# Patient Record
Sex: Female | Born: 1945 | Race: White | Hispanic: No | Smoking: Never smoker
Health system: Southern US, Community
[De-identification: ages and names within clinical notes are randomized; demographics above are authoritative.]

## PROBLEM LIST (undated history)

## (undated) DIAGNOSIS — E079 Disorder of thyroid, unspecified: Secondary | ICD-10-CM

## (undated) DIAGNOSIS — J449 Chronic obstructive pulmonary disease, unspecified: Secondary | ICD-10-CM

---

## 2004-03-02 ENCOUNTER — Emergency Department: Payer: Self-pay | Admitting: Emergency Medicine

## 2006-08-20 ENCOUNTER — Ambulatory Visit: Payer: Self-pay | Admitting: Specialist

## 2013-06-13 ENCOUNTER — Inpatient Hospital Stay: Payer: Self-pay | Admitting: Internal Medicine

## 2013-06-13 LAB — URINALYSIS, COMPLETE
Bilirubin,UR: NEGATIVE
GLUCOSE, UR: NEGATIVE mg/dL (ref 0–75)
Hyaline Cast: 6
Leukocyte Esterase: NEGATIVE
NITRITE: NEGATIVE
Ph: 6 (ref 4.5–8.0)
RBC,UR: 9 /HPF (ref 0–5)
Specific Gravity: 1.02 (ref 1.003–1.030)
Squamous Epithelial: 1

## 2013-06-13 LAB — CBC WITH DIFFERENTIAL/PLATELET
Basophil #: 0 10*3/uL (ref 0.0–0.1)
Basophil %: 0.1 %
EOS ABS: 0 10*3/uL (ref 0.0–0.7)
EOS PCT: 0 %
HCT: 37.8 % (ref 35.0–47.0)
HGB: 12.6 g/dL (ref 12.0–16.0)
Lymphocyte #: 0.7 10*3/uL — ABNORMAL LOW (ref 1.0–3.6)
Lymphocyte %: 4.1 %
MCH: 30.9 pg (ref 26.0–34.0)
MCHC: 33.2 g/dL (ref 32.0–36.0)
MCV: 93 fL (ref 80–100)
Monocyte #: 1.2 x10 3/mm — ABNORMAL HIGH (ref 0.2–0.9)
Monocyte %: 7 %
NEUTROS ABS: 15.4 10*3/uL — AB (ref 1.4–6.5)
Neutrophil %: 88.8 %
PLATELETS: 278 10*3/uL (ref 150–440)
RBC: 4.06 10*6/uL (ref 3.80–5.20)
RDW: 13.7 % (ref 11.5–14.5)
WBC: 17.3 10*3/uL — ABNORMAL HIGH (ref 3.6–11.0)

## 2013-06-13 LAB — PRO B NATRIURETIC PEPTIDE: B-Type Natriuretic Peptide: 2273 pg/mL — ABNORMAL HIGH (ref 0–125)

## 2013-06-13 LAB — BASIC METABOLIC PANEL
Anion Gap: 7 (ref 7–16)
BUN: 34 mg/dL — ABNORMAL HIGH (ref 7–18)
CO2: 29 mmol/L (ref 21–32)
Calcium, Total: 8.6 mg/dL (ref 8.5–10.1)
Chloride: 101 mmol/L (ref 98–107)
Creatinine: 0.99 mg/dL (ref 0.60–1.30)
EGFR (Non-African Amer.): 58 — ABNORMAL LOW
Glucose: 121 mg/dL — ABNORMAL HIGH (ref 65–99)
OSMOLALITY: 283 (ref 275–301)
Potassium: 4 mmol/L (ref 3.5–5.1)
SODIUM: 137 mmol/L (ref 136–145)

## 2013-06-13 LAB — CK TOTAL AND CKMB (NOT AT ARMC): CK, Total: 28 U/L (ref 21–215)

## 2013-06-13 LAB — APTT: ACTIVATED PTT: 29 s (ref 23.6–35.9)

## 2013-06-13 LAB — TROPONIN I: TROPONIN-I: 0.08 ng/mL — AB

## 2013-06-13 LAB — PROTIME-INR
INR: 1
PROTHROMBIN TIME: 13.5 s (ref 11.5–14.7)

## 2013-06-14 LAB — BASIC METABOLIC PANEL
Anion Gap: 6 — ABNORMAL LOW (ref 7–16)
BUN: 26 mg/dL — ABNORMAL HIGH (ref 7–18)
CALCIUM: 8.8 mg/dL (ref 8.5–10.1)
CO2: 30 mmol/L (ref 21–32)
Chloride: 100 mmol/L (ref 98–107)
Creatinine: 0.64 mg/dL (ref 0.60–1.30)
EGFR (Non-African Amer.): >60
GLUCOSE: 138 mg/dL — AB (ref 65–99)
Osmolality: 279 (ref 275–301)
Potassium: 4.4 mmol/L (ref 3.5–5.1)
Sodium: 136 mmol/L (ref 136–145)

## 2013-06-14 LAB — CBC WITH DIFFERENTIAL/PLATELET
BASOS ABS: 0 10*3/uL (ref 0.0–0.1)
Basophil %: 0 %
EOS ABS: 0 10*3/uL (ref 0.0–0.7)
Eosinophil %: 0 %
HCT: 36 % (ref 35.0–47.0)
HGB: 12.1 g/dL (ref 12.0–16.0)
LYMPHS ABS: 0.4 10*3/uL — AB (ref 1.0–3.6)
Lymphocyte %: 2.7 %
MCH: 31.1 pg (ref 26.0–34.0)
MCHC: 33.5 g/dL (ref 32.0–36.0)
MCV: 93 fL (ref 80–100)
Monocyte #: 0.2 x10 3/mm (ref 0.2–0.9)
Monocyte %: 1.7 %
Neutrophil #: 13.7 10*3/uL — ABNORMAL HIGH (ref 1.4–6.5)
Neutrophil %: 95.6 %
Platelet: 246 10*3/uL (ref 150–440)
RBC: 3.87 10*6/uL (ref 3.80–5.20)
RDW: 13.4 % (ref 11.5–14.5)
WBC: 14.3 10*3/uL — AB (ref 3.6–11.0)

## 2013-06-15 LAB — URINE CULTURE

## 2013-06-16 LAB — CBC WITH DIFFERENTIAL/PLATELET
BASOS ABS: 0 10*3/uL (ref 0.0–0.1)
BASOS PCT: 0.1 %
EOS ABS: 0 10*3/uL (ref 0.0–0.7)
Eosinophil %: 0 %
HCT: 36.9 % (ref 35.0–47.0)
HGB: 12.3 g/dL (ref 12.0–16.0)
LYMPHS ABS: 0.4 10*3/uL — AB (ref 1.0–3.6)
Lymphocyte %: 3.5 %
MCH: 31.1 pg (ref 26.0–34.0)
MCHC: 33.4 g/dL (ref 32.0–36.0)
MCV: 93 fL (ref 80–100)
MONO ABS: 0.4 x10 3/mm (ref 0.2–0.9)
Monocyte %: 3.3 %
Neutrophil #: 11.8 10*3/uL — ABNORMAL HIGH (ref 1.4–6.5)
Neutrophil %: 93.1 %
PLATELETS: 317 10*3/uL (ref 150–440)
RBC: 3.96 10*6/uL (ref 3.80–5.20)
RDW: 13.6 % (ref 11.5–14.5)
WBC: 12.7 10*3/uL — ABNORMAL HIGH (ref 3.6–11.0)

## 2013-06-17 LAB — EXPECTORATED SPUTUM ASSESSMENT W REFEX TO RESP CULTURE

## 2013-06-18 LAB — CULTURE, BLOOD (SINGLE)

## 2014-09-02 NOTE — H&P (Signed)
PATIENT NAME:  Teresa Hartman, [name] MR#:  [id number] DATE OF BIRTH:  08-11-1945  DATE OF ADMISSION:  06/13/2013  PRIMARY CARE PROVIDER: Alan MulderShamil J. Morayati, MD  EMERGENCY DEPARTMENT REFERRING PHYSICIAN: Rockne MenghiniAnne-Caroline Norman, MD  CHIEF COMPLAINT: Decrease in responsiveness, shortness of breath.   HISTORY OF PRESENT ILLNESS: The patient is a 69 year old white female with history of asthma according to her as well as hypothyroidism and borderline diabetes, who has been having shortness of breath for about 1 week. Was seen at a walk-in clinic on Saturday and was discharged on some cough syrup with hydrocodone as well as Mucinex and some antibiotics  with Omnicef. The patient's symptoms continued to not improve. She has not been eating or drinking much and today, when her son went to check on her, she was slumped over and very hard to arouse. She was brought to the ED and in the ER received Narcan with no significant improvement in her symptoms. She did have an ABG which showed that she had hypercarbia with a pCO2 of 64. She was placed on BiPAP. Since then, her mental status is a little improved. She has not had any chest pains or any palpitations. Denies any abdominal pain, nausea, vomiting or diarrhea. Has not had any fevers.   PAST MEDICAL HISTORY: Significant for asthma, hypothyroidism, diabetes type 2.   ALLERGIES: PENICILLIN.   CURRENT MEDICATIONS: At home Advair 1 puff b.i.d., she states that she uses that as needed. Cefdinir 300 mg 1 tab p.o. b.i.d., Lasix 40 daily, homatropine/ hydrocodone 1.5 mg per 5 mg per 5 mL daily as needed for cough, Invokana 300 mg daily, Synthroid 75 mcg daily, vitamin D2 50,000 international units 1 tablet b.i.d.   SOCIAL HISTORY: Smokes a few cigarettes a day. Has been a lifelong smoker, drinks socially. No drug use. Lives alone.   FAMILY HISTORY: Positive for hypertension.   REVIEW OF SYSTEMS: CONSTITUTIONAL: Denies any fevers. Complains of fatigue, weakness. No pain.  No weight loss. No weight gain.  EYES: No blurred or double vision. No glaucoma or cataracts.  ENT: No tinnitus. No ear pain. No hearing loss. No seasonal or year-round allergies. No difficulty swallowing.  RESPIRATORY: Complains of cough, wheezing. No hemoptysis. Has dyspnea. Reports asthma.  CARDIOVASCULAR: Denies any chest pain, orthopnea, edema, arrhythmia or palpitations.  GASTROINTESTINAL: No nausea, vomiting, diarrhea. No abdominal pain. No hematemesis. No melena.  GENITOURINARY: Denies any dysuria, hematuria, renal calculus or frequency.  ENDOCRINE: Denies any polyuria, nocturia. Thyroid problems.  HEMATOLOGIC AND LYMPHATIC: Denies anemia, easy bruisability or bleeding.  SKIN: No acne. No rash. No changes in mole, hair or skin.  MUSCULOSKELETAL: Denies any pain in the neck, back or shoulder.  NEUROLOGIC: No numbness. No CVA. No TIA. No seizures.  PSYCHIATRIC: No anxiety. No insomnia. No ADD.   PHYSICAL EXAMINATION: VITAL SIGNS: Temperature 102.1, pulse 118, respirations 26, blood pressure 140/66, O2 was 54% on room air, currently on BiPAP.  Appears critically ill.  HEENT: Head atraumatic, normocephalic. Pupils equally round, reactive to light and accommodation. There is no conjunctival pallor. No scleral icterus. Nasal exam shows no drainage or ulceration. Oropharynx is clear without any exudate.  NECK: Supple without any JVD.  CARDIOVASCULAR: Regular rate and rhythm. No murmurs, rubs, clicks, gallops,  LUNGS: She has got some accessory muscle usage, currently on BiPAP. Has bilateral wheezing. There are no rhonchi or crackles.  ABDOMEN: Soft, nontender, nondistended. Positive bowel sounds x 4.  EXTREMITIES: No clubbing, cyanosis or edema.  SKIN: No rash.  LYMPHATICS:  No lymph nodes palpable.  VASCULAR: Good DP, PT pulses.  PSYCHIATRIC: Not anxious or depressed.  NEUROLOGIC: The patient is intermittently drowsy but is able to answer questions. Cranial nerves II through XII grossly  intact. No focal deficits.  PSYCHIATRIC: Not anxious or depressed.   LABORATORY, DIAGNOSTIC AND RADIOLOGICAL DATA:  So far, ABG with pH 7.25, pCO2 of 64, pO2 of 53. WBC count 17.3, hemoglobin 12.6, platelet count 278. INR 1.0. Urinalysis is negative. Chest x-ray shows questionable right hilar adenopathy, contrast-enhanced CT to further evaluate interstitial infiltrate in the right middle and lower lobes.   ASSESSMENT AND PLAN: The patient is a 69 year old white female with a history of asthma, diabetes, hypothyroidism, presents with decrease in responsiveness, acute respiratory failure.  1.  Acute respiratory failure, hypercarbic in nature due to likely a combination of asthma flare, asthma/chronic obstructive pulmonary disease exacerbation as well as pneumonia. At this time, we will treat her with IV Solu-Medrol q.6 hours. Nebulizers around the clock. Will place her on IV Levaquin. Continue BiPAP. Will wean as tolerated. Pulmonary consult.  2.  Pneumonia.  We will treat her for community-acquired pneumonia with IV Levaquin.  3.  Hypothyroidism. Continue Synthroid.  4.  Diabetes type 2. We will place her on sliding scale. She is on Invokana and she reports that she is on metformin; however, that is not one of her medications. I will just keep her on sliding scale insulin for now.  5.  Miscellaneous. We will do heparin for deep vein thrombosis prophylaxis.   NOTE: 50 minutes of critical care time spent on this patient.     ____________________________ Lacie Scotts. Allena Katz, MD shp:cs D: 06/13/2013 15:34:49 ET T: 06/13/2013 15:56:33 ET JOB#: [id number]  cc: Shamyia Grandpre H. Allena Katz, MD, <Dictator> Charise Carwin MD ELECTRONICALLY SIGNED 06/26/2013 13:10

## 2014-09-02 NOTE — Discharge Summary (Signed)
PATIENT NAME:  Teresa Hartman, Teresa Hartman MR#:  [id number] DATE OF BIRTH:  1945/10/16  DATE OF ADMISSION:  06/13/2013 DATE OF DISCHARGE:  06/16/2013.  ADMISSION DIAGNOSES: 1.  Acute respiratory failure secondary to community-acquired pneumonia and chronic obstructive pulmonary disease exacerbation. 2.  Community-acquired pneumonia. 3.  Hypothyroidism.  4.  Type 2 diabetes.  CONSULTATIONS:  Pulmonary, Dr. Welton FlakesKhan.   LABORATORIES AT DISCHARGE:  White blood cells 12.7, hemoglobin 12.3, hematocrit 37, platelets are 317. Chest x-ray on the day of discharge:  Improved aeration throughout the right lung suggesting resolving multilobular right-sided bronchopneumonia.   Blood cultures negative to date.   HOSPITAL COURSE:  A very pleasant 69 year old female who was admitted with acute respiratory failure. For further details, please refer to the Hartman and P.   1.  Acute respiratory failure. The patient was admitted for community-acquired pneumonia as well as acute COPD exacerbation. Her chest x-ray was consistent with a right middle and right lower lobe pneumonia. She was placed on IV antibiotics as well as IV steroids. Dr. Welton FlakesKhan saw the patient in consultation. She is still requiring oxygen, predominantly due to pneumonia and COPD exacerbation. She will require some oxygen for hopefully a short amount of time. She does feel comfortable being discharged and actually said that she would like to be discharged today. She was initially on BiPAP on admission, which was titrated to nasal cannula. She will need home oxygen as well as a CT scan in 4 weeks. Her initial chest x-ray did show some hilar lymphadenopathy, which needs to be worked up. We did refer her to Dr. Welton FlakesKhan as an outpatient. 2.  Community-acquired pneumonia. The patient was continued on Levaquin. 3.  Hypothyroidism. The patient will continue Synthroid.  4.  Type 2 diabetes. The patient is to resume her outpatient medications.     DISCHARGE MEDICATIONS:  1.   Advair Diskus 250/50 b.i.d.  2.  Invokana 300 mg daily.  3.  Synthroid 75 mcg daily.  4.  Homatropine and hydrocodone 5 mL daily as needed.  5.  Vitamin D 50,000 International Units 2 times a week.  6.  Lasix 40 mg daily. 7.  Prednisone taper starting at 60 mg, taper over 10 mg every 3 days. 8.  Tiotropium 18 mcg daily.  9.  Levaquin 750 mg daily for 5 days.   The patient will be discharged with home health, physical therapy, nurse and nurse aide. The patient will be discharged on 2 to 3 L nasal cannula, keeping O2 sats greater than 92%.   DISCHARGE DIET:  ADA diet.   DISCHARGE ACTIVITY:  As tolerated.   FOLLOWUP:  In one week with Dr. Welton FlakesKhan. The patient will need a CT scan in 4 weeks after resolution of her pneumonia.   TIME SPENT:  Approximately 40 minutes on discharge. The patient is medically stable for discharge.    ____________________________ Eddy Liszewski P. Juliene PinaMody, MD spm:jm D: 06/16/2013 14:17:43 ET T: 06/16/2013 15:01:50 ET JOB#: [id number]  cc: Jazmynn Pho P. Juliene PinaMody, MD, <Dictator> Yevonne PaxSaadat A. Khan, MD Janyth ContesSITAL P Kemal Amores MD ELECTRONICALLY SIGNED 06/16/2013 21:34

## 2015-01-18 ENCOUNTER — Emergency Department: Payer: Medicare Other

## 2015-01-18 ENCOUNTER — Encounter: Payer: Self-pay | Admitting: Emergency Medicine

## 2015-01-18 ENCOUNTER — Emergency Department
Admission: EM | Admit: 2015-01-18 | Discharge: 2015-01-19 | Disposition: A | Discharge status: Home / Self Care | Payer: Medicare Other | Attending: Emergency Medicine | Admitting: Emergency Medicine

## 2015-01-18 DIAGNOSIS — J441 Chronic obstructive pulmonary disease with (acute) exacerbation: Secondary | ICD-10-CM | POA: Diagnosis not present

## 2015-01-18 DIAGNOSIS — Z880 Allergy status to penicillin: Secondary | ICD-10-CM | POA: Insufficient documentation

## 2015-01-18 DIAGNOSIS — R112 Nausea with vomiting, unspecified: Secondary | ICD-10-CM | POA: Insufficient documentation

## 2015-01-18 DIAGNOSIS — R000 Tachycardia, unspecified: Secondary | ICD-10-CM | POA: Diagnosis not present

## 2015-01-18 DIAGNOSIS — R197 Diarrhea, unspecified: Secondary | ICD-10-CM | POA: Diagnosis not present

## 2015-01-18 HISTORY — DX: Disorder of thyroid, unspecified: E07.9

## 2015-01-18 HISTORY — DX: Chronic obstructive pulmonary disease, unspecified: J44.9

## 2015-01-18 LAB — CBC WITH DIFFERENTIAL/PLATELET
BASOS PCT: 0 %
Basophils Absolute: 0.1 10*3/uL (ref 0–0.1)
EOS ABS: 0 10*3/uL (ref 0–0.7)
EOS PCT: 0 %
HCT: 29.9 % — ABNORMAL LOW (ref 35.0–47.0)
Hemoglobin: 10 g/dL — ABNORMAL LOW (ref 12.0–16.0)
Lymphocytes Relative: 9 %
Lymphs Abs: 1.3 10*3/uL (ref 1.0–3.6)
MCH: 30.1 pg (ref 26.0–34.0)
MCHC: 33.3 g/dL (ref 32.0–36.0)
MCV: 90.4 fL (ref 80.0–100.0)
MONO ABS: 0.6 10*3/uL (ref 0.2–0.9)
MONOS PCT: 5 %
NEUTROS PCT: 86 %
Neutro Abs: 11.9 10*3/uL — ABNORMAL HIGH (ref 1.4–6.5)
PLATELETS: 267 10*3/uL (ref 150–440)
RBC: 3.31 MIL/uL — ABNORMAL LOW (ref 3.80–5.20)
RDW: 14.7 % — AB (ref 11.5–14.5)
WBC: 13.9 10*3/uL — ABNORMAL HIGH (ref 3.6–11.0)

## 2015-01-18 LAB — GLUCOSE, CAPILLARY: GLUCOSE-CAPILLARY: 151 mg/dL — AB (ref 65–99)

## 2015-01-18 MED ORDER — IPRATROPIUM-ALBUTEROL 0.5-2.5 (3) MG/3ML IN SOLN
3.0000 mL | Freq: Once | RESPIRATORY_TRACT | Status: AC
  Administered 2015-01-18: 3 mL via RESPIRATORY_TRACT
  Filled 2015-01-18: qty 3

## 2015-01-18 MED ORDER — ONDANSETRON HCL 4 MG/2ML IJ SOLN
INTRAMUSCULAR | Status: AC
  Filled 2015-01-18: qty 2

## 2015-01-18 MED ORDER — ONDANSETRON HCL 4 MG/2ML IJ SOLN
4.0000 mg | Freq: Once | INTRAMUSCULAR | Status: AC
  Administered 2015-01-18: 4 mg via INTRAVENOUS

## 2015-01-18 MED ORDER — SODIUM CHLORIDE 0.9 % IV BOLUS (SEPSIS)
1000.0000 mL | Freq: Once | INTRAVENOUS | Status: AC
  Administered 2015-01-18: 1000 mL via INTRAVENOUS
  Filled 2015-01-18: qty 1000

## 2015-01-18 NOTE — ED Provider Notes (Signed)
[facility] Emergency Department Provider Note    ____________________________________________  Time seen: 1110  I have reviewed the triage vital signs and the nursing notes.   HISTORY  Chief Complaint Emesis   History limited by: Not Limited   HPI Teresa Hartman is a 69 y.o. female who presents to the emergency department tonight with concerns for vomiting, diarrhea and worsening shortness of breath. Patient states that she started developing vomiting and diarrhea yesterday. She has had some mild associated abdominal pain with this. She has not noticed any blood in the vomiting or diarrhea. This evening the patient was in her recliner when she had another episode of emesis. Her son found her and thought that she was gargling. He was concern for aspiration. Patient states that she does feel that she is having worsening shortness of breath currently. She does have a history of COPD however is not on any oxygen at home.     Past Medical History  Diagnosis Date  . COPD (chronic obstructive pulmonary disease)   . Thyroid disease     There are no active problems to display for this patient.   History reviewed. No pertinent past surgical history.  No current outpatient prescriptions on file.  Allergies Penicillins  History reviewed. No pertinent family history.  Social History Social History  Substance Use Topics  . Smoking status: Never Smoker   . Smokeless tobacco: None  . Alcohol Use: No    Review of Systems  Constitutional: Negative for fever. Cardiovascular: Negative for chest pain. Respiratory: Positive for shortness of breath. Gastrointestinal: Positive for vomiting and diarrhea Genitourinary: Negative for dysuria. Musculoskeletal: Negative for back pain. Skin: Negative for rash. Neurological: Negative for headaches, focal weakness or numbness.  10-point ROS otherwise  negative.  ____________________________________________   PHYSICAL EXAM:  VITAL SIGNS: ED Triage Vitals  Enc Vitals Group     BP 01/18/15 2252 144/104 mmHg     Pulse Rate 01/18/15 2252 115     Resp --      Temp 01/18/15 2252 97.8 F (36.6 C)     Temp Source 01/18/15 2252 Oral     SpO2 01/18/15 2252 95 %     Weight 01/18/15 2252 264 lb 8.8 oz (119.999 kg)     Height 01/18/15 2252  (1.753 m)     Head Cir --      Peak Flow --      Pain Score 01/18/15 2254 0   Constitutional: Alert and oriented. Mild respiratory distress Eyes: Conjunctivae are normal. PERRL. Normal extraocular movements. ENT   Head: Normocephalic and atraumatic.   Nose: No congestion/rhinnorhea.   Mouth/Throat: Mucous membranes are moist.   Neck: No stridor. Hematological/Lymphatic/Immunilogical: No cervical lymphadenopathy. Cardiovascular: Tachycardic, regular rhythm.  No murmurs, rubs, or gallops. Respiratory: Mild respiratory distress. Bilateral expiratory wheeze. Gastrointestinal: Soft and nontender. No distention. There is no CVA tenderness. Genitourinary: Deferred Musculoskeletal: Normal range of motion in all extremities. No joint effusions.  No lower extremity tenderness nor edema. Neurologic:  Normal speech and language. No gross focal neurologic deficits are appreciated. Speech is normal.  Skin:  Skin is warm, dry and intact. No rash noted. Psychiatric: Mood and affect are normal. Speech and behavior are normal. Patient exhibits appropriate insight and judgment.  ____________________________________________    LABS (pertinent positives/negatives)  Labs Reviewed  GLUCOSE, CAPILLARY - Abnormal; Notable for the following:    Glucose-Capillary 151 (*)    All other components within normal limits  CBC WITH DIFFERENTIAL/PLATELET -  Abnormal; Notable for the following:    WBC 13.9 (*)    RBC 3.31 (*)    Hemoglobin 10.0 (*)    HCT 29.9 (*)    RDW 14.7 (*)    Neutro Abs 11.9 (*)     All other components within normal limits  COMPREHENSIVE METABOLIC PANEL - Abnormal; Notable for the following:    Glucose, Bld 145 (*)    BUN 76 (*)    Calcium 8.8 (*)    Total Protein 6.3 (*)    Albumin 3.3 (*)    ALT 12 (*)    All other components within normal limits  LIPASE, BLOOD - Abnormal; Notable for the following:    Lipase 17 (*)    All other components within normal limits  TROPONIN I     ____________________________________________   EKG  I, Phineas Semen, attending physician, personally viewed and interpreted this EKG  EKG Time: 2300 Rate: 116 Rhythm: sinus tachycardia Axis: normal Intervals: qtc 444 QRS: narrow ST changes: no st elevation ____________________________________________    RADIOLOGY  CXR IMPRESSION: No active disease.  ____________________________________________   PROCEDURES  Procedure(s) performed: None  Critical Care performed: No  ____________________________________________   INITIAL IMPRESSION / ASSESSMENT AND PLAN / ED COURSE  Pertinent labs & imaging results that were available during my care of the patient were reviewed by me and considered in my medical decision making (see chart for details).  Patient presented to the emergency department today with concerns for nausea, vomiting and shortness of breath. On exam patient initially was some mild shortness of breath. This didn't resolve with the DuoNeb treatment. Additionally patient was given antiemetics. Patient had some diarrhea whilst here in the emergency department however felt better at time of discharge. Chest x-ray without any concerning findings.  ____________________________________________   FINAL CLINICAL IMPRESSION(S) / ED DIAGNOSES  Final diagnoses:  Nausea and vomiting, vomiting of unspecified type  Diarrhea     Phineas Semen, MD 01/19/15 [phone]

## 2015-01-18 NOTE — ED Notes (Signed)
Report received from Darl Pikes RN patient care assumed. Patient/RN introduction complete. Will continue to monitor.

## 2015-01-18 NOTE — ED Notes (Signed)
Pt with no complaints of pain at this time, no shob noted, co nausea.

## 2015-01-18 NOTE — ED Notes (Signed)
Started vomiting this afternoon. Also diarrhea.  Pt vomited in sleep and her son told ems she was "gurling" and may ave aspirated.

## 2015-01-19 LAB — COMPREHENSIVE METABOLIC PANEL
ALBUMIN: 3.3 g/dL — AB (ref 3.5–5.0)
ALK PHOS: 91 U/L (ref 38–126)
ALT: 12 U/L — AB (ref 14–54)
AST: 18 U/L (ref 15–41)
Anion gap: 8 (ref 5–15)
BUN: 76 mg/dL — ABNORMAL HIGH (ref 6–20)
CHLORIDE: 107 mmol/L (ref 101–111)
CO2: 24 mmol/L (ref 22–32)
CREATININE: 0.74 mg/dL (ref 0.44–1.00)
Calcium: 8.8 mg/dL — ABNORMAL LOW (ref 8.9–10.3)
GFR calc non Af Amer: >60 mL/min (ref >60)
GLUCOSE: 145 mg/dL — AB (ref 65–99)
Potassium: 4.1 mmol/L (ref 3.5–5.1)
SODIUM: 139 mmol/L (ref 135–145)
Total Bilirubin: 0.7 mg/dL (ref 0.3–1.2)
Total Protein: 6.3 g/dL — ABNORMAL LOW (ref 6.5–8.1)

## 2015-01-19 LAB — LIPASE, BLOOD: Lipase: 17 U/L — ABNORMAL LOW (ref 22–51)

## 2015-01-19 LAB — TROPONIN I

## 2015-01-19 MED ORDER — ONDANSETRON 4 MG PO TBDP
4.0000 mg | ORAL_TABLET | Freq: Once | ORAL | Status: AC
  Administered 2015-01-19: 4 mg via ORAL

## 2015-01-19 MED ORDER — ONDANSETRON 4 MG PO TBDP
ORAL_TABLET | ORAL | Status: AC
  Filled 2015-01-19: qty 1

## 2015-01-19 MED ORDER — ONDANSETRON HCL 4 MG PO TABS: 4.0000 mg | ORAL_TABLET | Freq: Every day | ORAL | Status: AC | PRN

## 2015-01-19 NOTE — Discharge Instructions (Signed)
Please seek medical attention for any high fevers, chest pain, shortness of breath, change in behavior, persistent vomiting, bloody stool or any other new or concerning symptoms. ° ° °Nausea and Vomiting °Nausea is a sick feeling that often comes before throwing up (vomiting). Vomiting is a reflex where stomach contents come out of your mouth. Vomiting can cause severe loss of body fluids (dehydration). Children and elderly adults can become dehydrated quickly, especially if they also have diarrhea. Nausea and vomiting are symptoms of a condition or disease. It is important to find the cause of your symptoms. °CAUSES  °· Direct irritation of the stomach lining. This irritation can result from increased acid production (gastroesophageal reflux disease), infection, food poisoning, taking certain medicines (such as nonsteroidal anti-inflammatory drugs), alcohol use, or tobacco use. °· Signals from the brain. These signals could be caused by a headache, heat exposure, an inner ear disturbance, increased pressure in the brain from injury, infection, a tumor, or a concussion, pain, emotional stimulus, or metabolic problems. °· An obstruction in the gastrointestinal tract (bowel obstruction). °· Illnesses such as diabetes, hepatitis, gallbladder problems, appendicitis, kidney problems, cancer, sepsis, atypical symptoms of a heart attack, or eating disorders. °· Medical treatments such as chemotherapy and radiation. °· Receiving medicine that makes you sleep (general anesthetic) during surgery. °DIAGNOSIS °Your caregiver may ask for tests to be done if the problems do not improve after a few days. Tests may also be done if symptoms are severe or if the reason for the nausea and vomiting is not clear. Tests may include: °· Urine tests. °· Blood tests. °· Stool tests. °· Cultures (to look for evidence of infection). °· X-rays or other imaging studies. °Test results can help your caregiver make decisions about treatment or the  need for additional tests. °TREATMENT °You need to stay well hydrated. Drink frequently but in small amounts. You may wish to drink water, sports drinks, clear broth, or eat frozen ice pops or gelatin dessert to help stay hydrated. When you eat, eating slowly may help prevent nausea. There are also some antinausea medicines that may help prevent nausea. °HOME CARE INSTRUCTIONS  °· Take all medicine as directed by your caregiver. °· If you do not have an appetite, do not force yourself to eat. However, you must continue to drink fluids. °· If you have an appetite, eat a normal diet unless your caregiver tells you differently. °¨ Eat a variety of complex carbohydrates (rice, wheat, potatoes, bread), lean meats, yogurt, fruits, and vegetables. °¨ Avoid high-fat foods because they are more difficult to digest. °· Drink enough water and fluids to keep your urine clear or pale yellow. °· If you are dehydrated, ask your caregiver for specific rehydration instructions. Signs of dehydration may include: °¨ Severe thirst. °¨ Dry lips and mouth. °¨ Dizziness. °¨ Dark urine. °¨ Decreasing urine frequency and amount. °¨ Confusion. °¨ Rapid breathing or pulse. °SEEK IMMEDIATE MEDICAL CARE IF:  °· You have blood or brown flecks (like coffee grounds) in your vomit. °· You have black or bloody stools. °· You have a severe headache or stiff neck. °· You are confused. °· You have severe abdominal pain. °· You have chest pain or trouble breathing. °· You do not urinate at least once every 8 hours. °· You develop cold or clammy skin. °· You continue to vomit for longer than 24 to 48 hours. °· You have a fever. °MAKE SURE YOU:  °· Understand these instructions. °· Will watch your condition. °· Will get help right away   if you are not doing well or get worse. °Document Released: 04/28/2005 Document Revised: 07/21/2011 Document Reviewed: 09/25/2010 °[employer]® Patient Information ©2015 [employer]. This information is not intended to  replace advice given to you by your health care provider. Make sure you discuss any questions you have with your health care provider. ° °

## 2015-01-19 NOTE — ED Notes (Signed)
Pt resting comfortably no distress noted, awaiting disposition.

## 2015-01-19 NOTE — ED Notes (Signed)
Oxygen off per md will continue to monitor

## 2015-01-19 NOTE — ED Notes (Signed)
Patient discharged to home per MD order. Patient in stable condition, and deemed medically cleared by ED provider for discharge. Discharge instructions reviewed with patient/family using "Teach Back"; verbalized understanding of medication education and administration, and information about follow-up care. Denies further concerns. ° °

## 2015-01-19 NOTE — ED Notes (Signed)
Upon discharge patient became dizzy and nauseous.  States went to bathroom and had black diarrhea.  MD made aware, med given.

## 2015-01-19 NOTE — ED Notes (Signed)
Pt states she feels better, feels ready to go home.  MD aware , pt home with son.

## 2016-09-20 IMAGING — CR DG CHEST 1V PORT
1 series · 1 of 1 positions shown · non-contrast
Comparison: 06/16/2013

CLINICAL DATA: Vomiting this afternoon and diarrhea. Possible
aspiration. Shortness of breath.

EXAM:
PORTABLE CHEST - 1 VIEW

[ap]
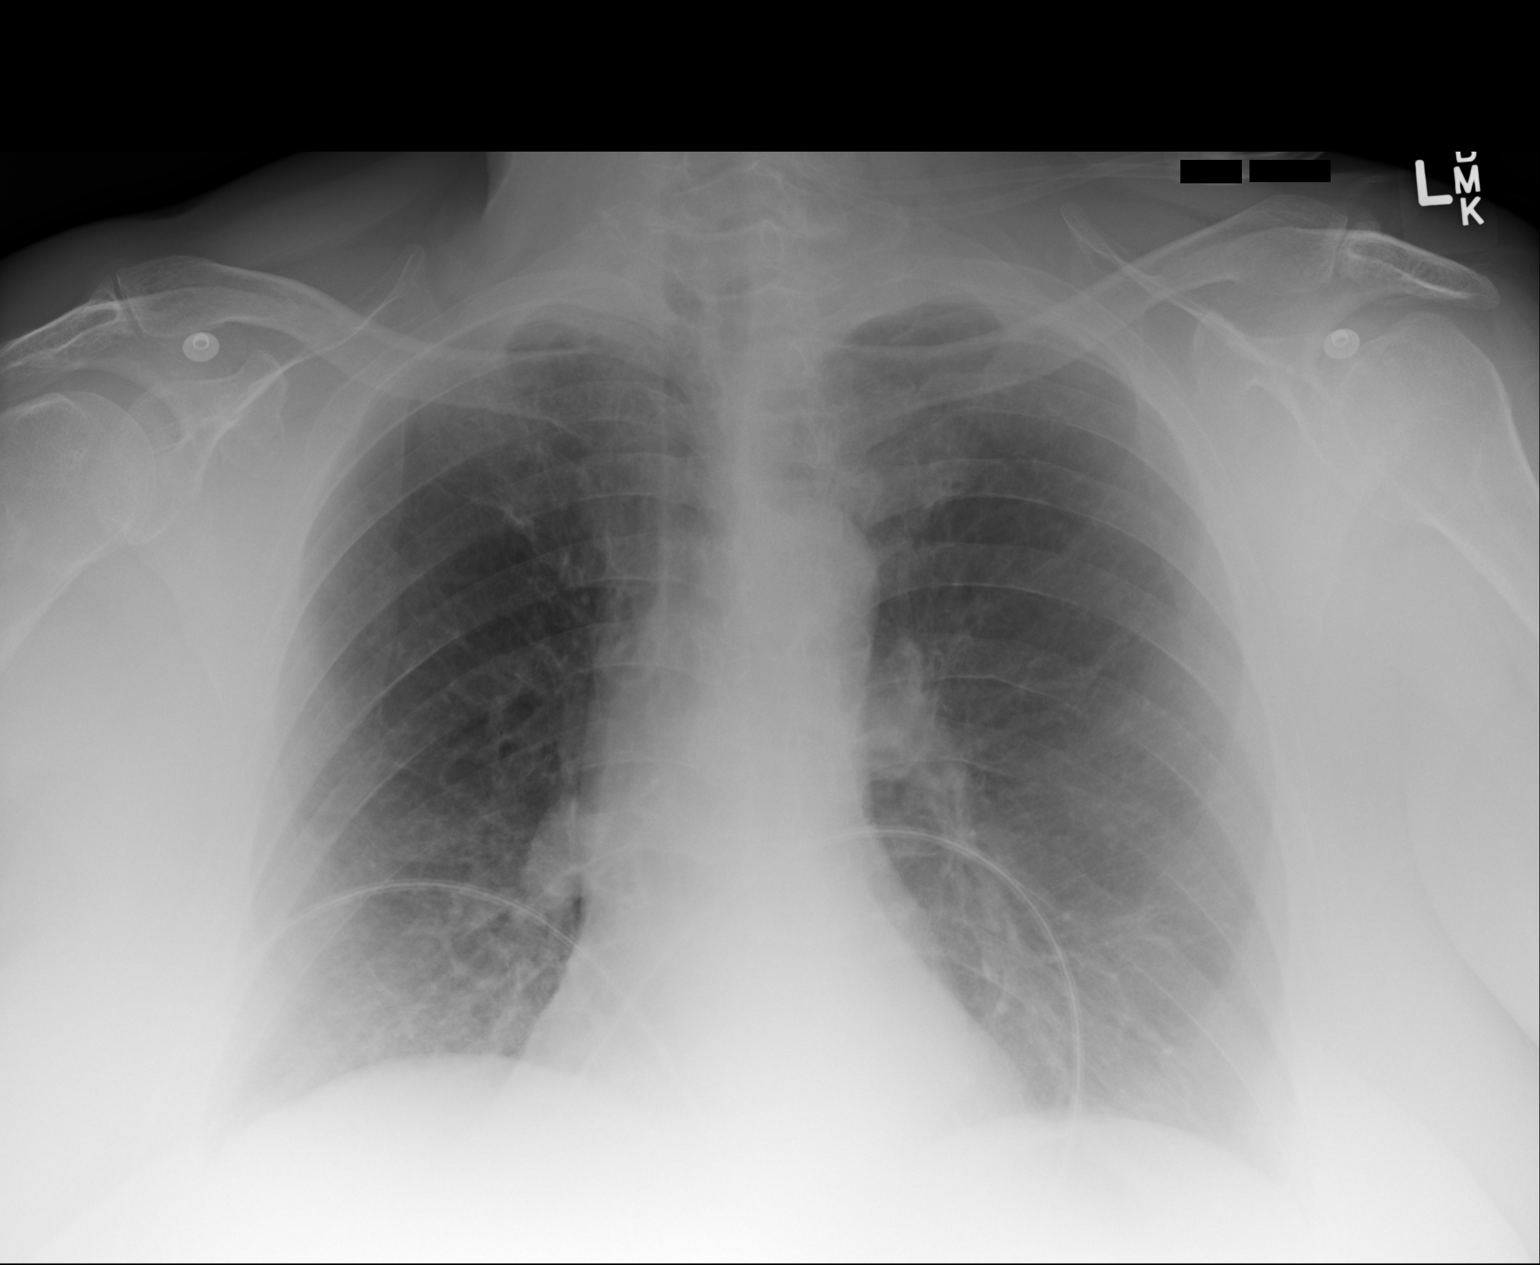

[1 of 1 positions shown; findings below may reference images not displayed]

FINDINGS: Normal heart size and pulmonary vascularity. Probable emphysematous
changes in the upper lungs. Shallow inspiration. No focal airspace
disease or consolidation. No infiltrates to suggest aspiration. No
blunting of costophrenic angles. No pneumothorax. Mediastinal
contours appear intact.
IMPRESSION: No active disease.

## 2020-11-26 ENCOUNTER — Emergency Department (HOSPITAL_COMMUNITY): Payer: Medicare PPO

## 2020-11-26 ENCOUNTER — Other Ambulatory Visit: Payer: Self-pay

## 2020-11-26 ENCOUNTER — Inpatient Hospital Stay (HOSPITAL_COMMUNITY): Payer: Medicare PPO

## 2020-11-26 ENCOUNTER — Inpatient Hospital Stay (HOSPITAL_COMMUNITY)
Admission: EM | Admit: 2020-11-26 | Discharge: 2020-12-10 | DRG: 064 | Disposition: E | Payer: Medicare PPO | Attending: Family Medicine | Admitting: Family Medicine

## 2020-11-26 ENCOUNTER — Encounter (HOSPITAL_COMMUNITY): Payer: Self-pay | Admitting: *Deleted

## 2020-11-26 DIAGNOSIS — J9600 Acute respiratory failure, unspecified whether with hypoxia or hypercapnia: Secondary | ICD-10-CM | POA: Diagnosis present

## 2020-11-26 DIAGNOSIS — Z66 Do not resuscitate: Secondary | ICD-10-CM | POA: Diagnosis present

## 2020-11-26 DIAGNOSIS — R4781 Slurred speech: Secondary | ICD-10-CM | POA: Diagnosis present

## 2020-11-26 DIAGNOSIS — G8194 Hemiplegia, unspecified affecting left nondominant side: Secondary | ICD-10-CM | POA: Diagnosis present

## 2020-11-26 DIAGNOSIS — F419 Anxiety disorder, unspecified: Secondary | ICD-10-CM | POA: Diagnosis present

## 2020-11-26 DIAGNOSIS — S0081XA Abrasion of other part of head, initial encounter: Secondary | ICD-10-CM | POA: Diagnosis present

## 2020-11-26 DIAGNOSIS — S0083XA Contusion of other part of head, initial encounter: Secondary | ICD-10-CM | POA: Diagnosis present

## 2020-11-26 DIAGNOSIS — Z6841 Body Mass Index (BMI) 40.0 and over, adult: Secondary | ICD-10-CM

## 2020-11-26 DIAGNOSIS — I639 Cerebral infarction, unspecified: Secondary | ICD-10-CM | POA: Diagnosis present

## 2020-11-26 DIAGNOSIS — R414 Neurologic neglect syndrome: Secondary | ICD-10-CM | POA: Diagnosis present

## 2020-11-26 DIAGNOSIS — I63511 Cerebral infarction due to unspecified occlusion or stenosis of right middle cerebral artery: Secondary | ICD-10-CM | POA: Diagnosis present

## 2020-11-26 DIAGNOSIS — I6521 Occlusion and stenosis of right carotid artery: Secondary | ICD-10-CM | POA: Diagnosis present

## 2020-11-26 DIAGNOSIS — Z515 Encounter for palliative care: Secondary | ICD-10-CM | POA: Diagnosis not present

## 2020-11-26 DIAGNOSIS — I63411 Cerebral infarction due to embolism of right middle cerebral artery: Secondary | ICD-10-CM | POA: Diagnosis not present

## 2020-11-26 DIAGNOSIS — W1839XA Other fall on same level, initial encounter: Secondary | ICD-10-CM | POA: Diagnosis present

## 2020-11-26 DIAGNOSIS — I6330 Cerebral infarction due to thrombosis of unspecified cerebral artery: Secondary | ICD-10-CM | POA: Insufficient documentation

## 2020-11-26 DIAGNOSIS — E876 Hypokalemia: Secondary | ICD-10-CM | POA: Diagnosis present

## 2020-11-26 DIAGNOSIS — E079 Disorder of thyroid, unspecified: Secondary | ICD-10-CM | POA: Diagnosis present

## 2020-11-26 DIAGNOSIS — J449 Chronic obstructive pulmonary disease, unspecified: Secondary | ICD-10-CM | POA: Diagnosis present

## 2020-11-26 DIAGNOSIS — U071 COVID-19: Secondary | ICD-10-CM | POA: Diagnosis present

## 2020-11-26 DIAGNOSIS — G936 Cerebral edema: Secondary | ICD-10-CM | POA: Diagnosis present

## 2020-11-26 DIAGNOSIS — R29810 Facial weakness: Secondary | ICD-10-CM | POA: Diagnosis present

## 2020-11-26 DIAGNOSIS — Y92002 Bathroom of unspecified non-institutional (private) residence single-family (private) house as the place of occurrence of the external cause: Secondary | ICD-10-CM | POA: Diagnosis not present

## 2020-11-26 LAB — DIFFERENTIAL
Abs Immature Granulocytes: 0.06 10*3/uL (ref 0.00–0.07)
Basophils Absolute: 0 10*3/uL (ref 0.0–0.1)
Basophils Relative: 0 %
Eosinophils Absolute: 0.1 10*3/uL (ref 0.0–0.5)
Eosinophils Relative: 1 %
Immature Granulocytes: 1 %
Lymphocytes Relative: 19 %
Lymphs Abs: 1.3 10*3/uL (ref 0.7–4.0)
Monocytes Absolute: 0.5 10*3/uL (ref 0.1–1.0)
Monocytes Relative: 8 %
Neutro Abs: 4.6 10*3/uL (ref 1.7–7.7)
Neutrophils Relative %: 71 %

## 2020-11-26 LAB — COMPREHENSIVE METABOLIC PANEL
ALT: 14 U/L (ref 0–44)
AST: 19 U/L (ref 15–41)
Albumin: 3.3 g/dL — ABNORMAL LOW (ref 3.5–5.0)
Alkaline Phosphatase: 114 U/L (ref 38–126)
Anion gap: 10 (ref 5–15)
BUN: 8 mg/dL (ref 8–23)
CO2: 28 mmol/L (ref 22–32)
Calcium: 8.6 mg/dL — ABNORMAL LOW (ref 8.9–10.3)
Chloride: 98 mmol/L (ref 98–111)
Creatinine, Ser: 0.79 mg/dL (ref 0.44–1.00)
GFR, Estimated: >60 mL/min (ref >60)
Glucose, Bld: 154 mg/dL — ABNORMAL HIGH (ref 70–99)
Potassium: 3.2 mmol/L — ABNORMAL LOW (ref 3.5–5.1)
Sodium: 136 mmol/L (ref 135–145)
Total Bilirubin: 1.3 mg/dL — ABNORMAL HIGH (ref 0.3–1.2)
Total Protein: 6.3 g/dL — ABNORMAL LOW (ref 6.5–8.1)

## 2020-11-26 LAB — I-STAT CHEM 8, ED
BUN: 10 mg/dL (ref 8–23)
Calcium, Ion: 1.03 mmol/L — ABNORMAL LOW (ref 1.15–1.40)
Chloride: 98 mmol/L (ref 98–111)
Creatinine, Ser: 0.7 mg/dL (ref 0.44–1.00)
Glucose, Bld: 151 mg/dL — ABNORMAL HIGH (ref 70–99)
HCT: 40 % (ref 36.0–46.0)
Hemoglobin: 13.6 g/dL (ref 12.0–15.0)
Potassium: 3.2 mmol/L — ABNORMAL LOW (ref 3.5–5.1)
Sodium: 138 mmol/L (ref 135–145)
TCO2: 31 mmol/L (ref 22–32)

## 2020-11-26 LAB — CBC
HCT: 40.9 % (ref 36.0–46.0)
Hemoglobin: 13.7 g/dL (ref 12.0–15.0)
MCH: 32.3 pg (ref 26.0–34.0)
MCHC: 33.5 g/dL (ref 30.0–36.0)
MCV: 96.5 fL (ref 80.0–100.0)
Platelets: 277 10*3/uL (ref 150–400)
RBC: 4.24 MIL/uL (ref 3.87–5.11)
RDW: 15.7 % — ABNORMAL HIGH (ref 11.5–15.5)
WBC: 6.6 10*3/uL (ref 4.0–10.5)
nRBC: 0 % (ref 0.0–0.2)

## 2020-11-26 LAB — PROTIME-INR
INR: 1 (ref 0.8–1.2)
Prothrombin Time: 13.3 seconds (ref 11.4–15.2)

## 2020-11-26 LAB — CBG MONITORING, ED: Glucose-Capillary: 151 mg/dL — ABNORMAL HIGH (ref 70–99)

## 2020-11-26 LAB — SODIUM: Sodium: 129 mmol/L — ABNORMAL LOW (ref 135–145)

## 2020-11-26 LAB — APTT: aPTT: 25 seconds (ref 24–36)

## 2020-11-26 LAB — RESP PANEL BY RT-PCR (FLU A&B, COVID) ARPGX2
Influenza A by PCR: NEGATIVE
Influenza B by PCR: NEGATIVE
SARS Coronavirus 2 by RT PCR: POSITIVE — AB

## 2020-11-26 MED ORDER — POLYVINYL ALCOHOL 1.4 % OP SOLN: 1.0000 [drp] | Freq: Four times a day (QID) | OPHTHALMIC | Status: DC | PRN

## 2020-11-26 MED ORDER — MORPHINE SULFATE (PF) 2 MG/ML IV SOLN
2.0000 mg | INTRAVENOUS | Status: DC | PRN
  Administered 2020-11-27 – 2020-11-28 (×7): 2 mg via INTRAVENOUS
  Filled 2020-11-26 (×2): qty 1
  Filled 2020-11-26: qty 2
  Filled 2020-11-26 (×4): qty 1

## 2020-11-26 MED ORDER — GLYCOPYRROLATE 1 MG PO TABS: 1.0000 mg | ORAL_TABLET | ORAL | Status: DC | PRN

## 2020-11-26 MED ORDER — IOHEXOL 350 MG/ML SOLN
140.0000 mL | Freq: Once | INTRAVENOUS | Status: AC | PRN
  Administered 2020-11-26: 140 mL via INTRAVENOUS

## 2020-11-26 MED ORDER — GLYCOPYRROLATE 0.2 MG/ML IJ SOLN
0.2000 mg | INTRAMUSCULAR | Status: DC | PRN
  Filled 2020-11-26 (×4): qty 1

## 2020-11-26 MED ORDER — SODIUM CHLORIDE 3 % IV SOLN
INTRAVENOUS | Status: DC
  Filled 2020-11-26 (×2): qty 500

## 2020-11-26 MED ORDER — POTASSIUM CHLORIDE 10 MEQ/100ML IV SOLN: 10.0000 meq | INTRAVENOUS | Status: AC

## 2020-11-26 MED ORDER — GLYCOPYRROLATE 0.2 MG/ML IJ SOLN
0.2000 mg | INTRAMUSCULAR | Status: DC | PRN
  Administered 2020-11-27 – 2020-11-28 (×8): 0.2 mg via INTRAVENOUS
  Filled 2020-11-26 (×4): qty 1

## 2020-11-26 MED ORDER — ACETAMINOPHEN 325 MG PO TABS: 650.0000 mg | ORAL_TABLET | Freq: Four times a day (QID) | ORAL | Status: DC | PRN

## 2020-11-26 MED ORDER — ACETAMINOPHEN 650 MG RE SUPP: 650.0000 mg | Freq: Four times a day (QID) | RECTAL | Status: DC | PRN

## 2020-11-26 MED ORDER — DIPHENHYDRAMINE HCL 50 MG/ML IJ SOLN: 25.0000 mg | INTRAMUSCULAR | Status: DC | PRN

## 2020-11-26 MED ORDER — SODIUM CHLORIDE 0.9% FLUSH
3.0000 mL | Freq: Once | INTRAVENOUS | Status: DC
Start: 2020-11-26 — End: 2020-11-29

## 2020-11-26 MED ORDER — LORAZEPAM 2 MG/ML IJ SOLN: 2.0000 mg | INTRAMUSCULAR | Status: DC | PRN

## 2020-11-26 NOTE — ED Notes (Signed)
Son at bedside.  Dr. Wilkie Aye spoke with son.

## 2020-11-26 NOTE — H&P (Addendum)
NAME:  Teresa Hartman, MRN:  [id number], DOB:  01-08-1946, LOS: 0 ADMISSION DATE:  12/18/2020, CONSULTATION DATE:  12-18-2020 REFERRING MD:  Dr. Wilkie Aye, CHIEF COMPLAINT:  Left Sided Weakness    History of Present Illness:  75 y/o F who presented to [facility] ER on 7/18 with reports of left sided weakness.   The patient was reportedly in her usual state of health on 7/17. She has limited mobility due to arthritis and uses a walker. Otherwise remains independent of ADL's.  She was last seen by her son at 44 on 7/17.  The patient reported LKN at 0500 (per son) when she went to the restroom.  She reported she was on the toilet and tried to swat at a bug, tipped over and fell.  Her son heard her moaning and went to check on he and found her down on the floor with bruising and abrasions to the left side of her face.  She denies falling. She denies weakness on the left (despite being flaccid on left). EMS found her with left sided weakness, slurred speech and rightward gaze deviation.  In the ER she was noted to have drooling, gurgling and severe dysarthria.  Emergent CT imaging showed R MCA occlusion.  R ICA patent up to the point of occlusion. She was hemodynamically stable, on 4L O2.    PCCM consulted for admission.   Pertinent  Medical History  COPD Thyroid Disease   Significant Hospital Events: Including procedures, antibiotic start and stop dates in addition to other pertinent events   7/18 Admit with R MCA CVA   Interim History / Subjective:  As above  Objective   Blood pressure (!) 148/68, pulse 68, temperature (!) 96.9 F (36.1 C), temperature source Temporal, resp. rate 20, height 5\' 6"  (1.676 m), weight 115.6 kg, SpO2 99 %.       No intake or output data in the 24 hours ending 12-18-20 0828 Filed Weights   12-18-2020 0600 12-18-2020 0731  Weight: 115.6 kg 115.6 kg    Examination: General:  elderly female lying in bed in NAD HEENT: MM pink/moist, left sided facial droop Neuro: awake,  alert, oriented to person, place, time, events, moves right side spontaneously / to commands appropriately, rightward gaze, unable to follow fingers past midline, no movement on left side  CV: s1s2 irregular, ?AF on monitor in 70's vs PAC's, no m/r/g PULM: non-labored on 4L [place], lungs bilaterally clear  GI: soft, bsx4 active  Extremities: warm/dry, no edema  Skin: no rashes or lesions  Resolved Hospital Problem list      Assessment & Plan:   Acute R MCA CVA  ICA patient up to the point of occlusion.  ASPECT 3 on CT imaging.  Dense hemiplegia on left.  -admit to medical floor, full comfort measures  -diet if patient requests for comfort  -morphine PRN if needed for work of breathing / shortness of breath  -PRN ativan for anxiety  -open visitation for family  -DNR / DNI  -spiritual care consultation   Hypokalemia  -replace 7/18 -no further labs for comfort  Best Practice (right click and "Reselect all SmartList Selections" daily)   Diet/type: NPO DVT prophylaxis: SCD GI prophylaxis: N/A Lines: N/A Foley:  N/A Code Status:  DNR Last date of multidisciplinary goals of care discussion: MD, NP and patients son discussed plan of care.  Reviewed medical findings thus far to include significant stroke.  Patient's son indicates they would not want life support / prolonged  advanced therapies.  Prefer to move forward with comfort measures.    To [facility] as of 7/19 0700, PCCM will sign off.   Labs   CBC: Recent Labs  Lab 06-Dec-2020 0644 06-Dec-2020 0657  WBC 6.6  --   NEUTROABS 4.6  --   HGB 13.7 13.6  HCT 40.9 40.0  MCV 96.5  --   PLT 277  --     Basic Metabolic Panel: Recent Labs  Lab December 06, 2020 0644 Dec 06, 2020 0657  NA 136 138  K 3.2* 3.2*  CL 98 98  CO2 28  --   GLUCOSE 154* 151*  BUN 8 10  CREATININE 0.79 0.70  CALCIUM 8.6*  --    GFR: Estimated Creatinine Clearance: 76 mL/min (by C-G formula based on SCr of 0.7 mg/dL). Recent Labs  Lab 12/06/20 0644  WBC 6.6     Liver Function Tests: Recent Labs  Lab 12/06/2020 0644  AST 19  ALT 14  ALKPHOS 114  BILITOT 1.3*  PROT 6.3*  ALBUMIN 3.3*   No results for input(s): LIPASE, AMYLASE in the last 168 hours. No results for input(s): AMMONIA in the last 168 hours.  ABG    Component Value Date/Time   TCO2 31 12/06/20 0657     Coagulation Profile: Recent Labs  Lab 2020-12-06 0644  INR 1.0    Cardiac Enzymes: No results for input(s): CKTOTAL, CKMB, CKMBINDEX, TROPONINI in the last 168 hours.  HbA1C: No results found for: HGBA1C  CBG: Recent Labs  Lab December 06, 2020 0651  GLUCAP 151*    Review of Systems:   Unable to complete due to CVA.   Past Medical History:  She,  has a past medical history of COPD (chronic obstructive pulmonary disease) (HCC) and Thyroid disease.   Surgical History:  History reviewed. No pertinent surgical history.   Social History:   reports that she has never smoked. She has never used smokeless tobacco. She reports that she does not drink alcohol and does not use drugs.   Family History:  Her family history is not on file.   Allergies Allergies  Allergen Reactions   Penicillins Rash     Home Medications  Prior to Admission medications   Medication Sig Start Date End Date Taking? Authorizing Provider  ondansetron (ZOFRAN) 4 MG tablet Take 1 tablet (4 mg total) by mouth daily as needed for nausea or vomiting. 01/19/15   Phineas Semen, MD     Critical care time: 33 minutes      Canary Brim, MSN, APRN, NP-C, AGACNP-BC [facility] 12-06-20, 8:28 AM   Please see [email or web address] for pager details.   From 7A-7P if no response, please call [phone] After hours, please call ELink [phone]

## 2020-11-26 NOTE — ED Provider Notes (Signed)
[facility] EMERGENCY DEPARTMENT Provider Note   CSN: [id number] Arrival date & time: 11/12/2020  3646     History No chief complaint on file.   Teresa Hartman is a 75 y.o. female.  HPI   75 year old female presents to the ER as a code stroke. Report is that patient woke up at 430am to use the bathroom. Believes she felt normal and ambulated to the toilet. While sitting she reached to the ground to pick up a bug and fell forward hitting her head. Son found her on the bathroom floor this morning with left sided weakness and speech change. Patient evaluated at EMS triage as a code stroke with neurology team at bedside. Patient went straight to CT scanner.  Past Medical History:  Diagnosis Date   COPD (chronic obstructive pulmonary disease) (HCC)    Thyroid disease     There are no problems to display for this patient.   No past surgical history on file.   OB History   No obstetric history on file.     No family history on file.  Social History   Tobacco Use   Smoking status: Never  Substance Use Topics   Alcohol use: No   Drug use: No    Home Medications Prior to Admission medications   Medication Sig Start Date End Date Taking? Authorizing Provider  ondansetron (ZOFRAN) 4 MG tablet Take 1 tablet (4 mg total) by mouth daily as needed for nausea or vomiting. 01/19/15   Phineas Semen, MD    Allergies    Penicillins  Review of Systems   Review of Systems  Unable to perform ROS: Acuity of condition   Physical Exam Updated Vital Signs BP (!) 125/100   Pulse 87   Resp (!) 31   Wt 115.6 kg   SpO2 99%   BMI 37.64 kg/m   Physical Exam Vitals and nursing note reviewed.  HENT:     Head: Normocephalic.     Mouth/Throat:     Mouth: Mucous membranes are moist.  Eyes:     Pupils: Pupils are equal, round, and reactive to light.  Cardiovascular:     Rate and Rhythm: Normal rate.  Pulmonary:     Effort: Pulmonary effort is normal. No  respiratory distress.  Abdominal:     Palpations: Abdomen is soft.  Skin:    General: Skin is warm.  Neurological:     Mental Status: She is alert. She is disoriented.     Comments: Slurred speech, LUE and LLE weakness  Psychiatric:        Mood and Affect: Mood normal.    ED Results / Procedures / Treatments   Labs (all labs ordered are listed, but only abnormal results are displayed) Labs Reviewed  CBC - Abnormal; Notable for the following components:      Result Value   RDW 15.7 (*)    All other components within normal limits  I-STAT CHEM 8, ED - Abnormal; Notable for the following components:   Potassium 3.2 (*)    Glucose, Bld 151 (*)    Calcium, Ion 1.03 (*)    All other components within normal limits  RESP PANEL BY RT-PCR (FLU A&B, COVID) ARPGX2  PROTIME-INR  APTT  DIFFERENTIAL  COMPREHENSIVE METABOLIC PANEL  CBG MONITORING, ED    EKG None  Radiology No results found.  Procedures .Critical Care  Date/Time: 11/28/2020 9:15 AM Performed by: Rozelle Logan, DO Authorized by: Rozelle Logan,  DO   Critical care provider statement:    Critical care time (minutes):  45   Critical care was necessary to treat or prevent imminent or life-threatening deterioration of the following conditions:  CNS failure or compromise   Critical care was time spent personally by me on the following activities:  Discussions with consultants, evaluation of patient's response to treatment, examination of patient, ordering and performing treatments and interventions, ordering and review of laboratory studies, ordering and review of radiographic studies, pulse oximetry, re-evaluation of patient's condition, obtaining history from patient or surrogate and review of old charts   I assumed direction of critical care for this patient from another provider in my specialty: no     Care discussed with: admitting provider     Medications Ordered in ED Medications  sodium chloride flush  (NS) 0.9 % injection 3 mL (has no administration in time range)    ED Course  I have reviewed the triage vital signs and the nursing notes.  Pertinent labs & imaging results that were available during my care of the patient were reviewed by me and considered in my medical decision making (see chart for details).    MDM Rules/Calculators/A&P                          75 year old female presents to the ER with left sided neglect, weakness and slurred speech. Seen as a code stroke, neuro team at bedside. CT imaging shows a devastating stroke in the right MCA not amendable to TPA or IR. Patient currently protecting her airway. Concern from neuro team for severe brain edema, recommending hypertonic saline and ICU admission. Patients neuro exam unchanged, son is now at bedside and updated. Plan for ICU admit and continued care.  Final Clinical Impression(s) / ED Diagnoses Final diagnoses:  None    Rx / DC Orders ED Discharge Orders     None        Rozelle Logan, DO 20-Dec-2020 2542

## 2020-11-26 NOTE — Progress Notes (Addendum)
STROKE TEAM PROGRESS NOTE   INTERVAL HISTORY Son is at bedside. Discussed imaging results with him. Explained the difference between aggressive treatment vs comfort care. Her son notes that she would not want life support and would rather be comfortable at end of life. We discussed the extent of the stroke and it's impact on her morbidity. He notes that she would not want to be bed bound. He feels that comfort care would best fit with her wishes. CT scan of the head shows a massive right complete MCA territory infarct with a core of 185 cc and penumbra of 121 cc an aspect score of 3 with CT angiogram showing right ICA terminus occlusion with right M1 and right ACA occlusion patient is not a candidate for tPA due to complete right ICA territory infarction and not a candidate for endovascular thrombectomy also due to extensive large core of 185 cc Vitals:   11/25/2020 0845 11/09/2020 0900 11/24/2020 1045 11/19/2020 1100  BP: 130/76 (!) 142/83 128/82 (!) 152/83  Pulse: (!) 46 95 80 70  Resp: (!) 23 (!) 25 (!) 22 (!) 21  Temp:      TempSrc:      SpO2: 99% 100% 97% 100%  Weight:      Height:       CBC:  Recent Labs  Lab 11/30/2020 0644 11/28/2020 0657  WBC 6.6  --   NEUTROABS 4.6  --   HGB 13.7 13.6  HCT 40.9 40.0  MCV 96.5  --   PLT 277  --    Basic Metabolic Panel:  Recent Labs  Lab 11/16/2020 0644 11/10/2020 0657 12/08/2020 0747  NA 136 138 129*  K 3.2* 3.2*  --   CL 98 98  --   CO2 28  --   --   GLUCOSE 154* 151*  --   BUN 8 10  --   CREATININE 0.79 0.70  --   CALCIUM 8.6*  --   --    Lipid Panel: No results for input(s): CHOL, TRIG, HDL, CHOLHDL, VLDL, LDLCALC in the last 168 hours. HgbA1c: No results for input(s): HGBA1C in the last 168 hours. Urine Drug Screen: No results for input(s): LABOPIA, COCAINSCRNUR, LABBENZ, AMPHETMU, THCU, LABBARB in the last 168 hours.  Alcohol Level No results for input(s): ETH in the last 168 hours.  CT HEAD CODE STROKE WO CONTRAST Acute right MCA  territory infarct with loss of gray-white differentiation in the right basal ganglia and right frontal cortex.  Aspects 3.  Hyperdense right proximal MCA/carotid terminus concerning for thrombus.  Age-indeterminate right PCA territory infarct.  CTA head and neck Right ICA terminus occlusion with faint opacification of distal right M1 and M2 MCA branches.  Suspected occlusion of a distal right ACA branch.  Severe right intradural vertebral artery stenosis.  CT perfusion scan Core infarct involving the right MCA and ACA territories (185 mL) with approximately 121 mL of detected surrounding penumbra  PHYSICAL EXAM General: chronically ill appearing elderly lady lying comfortably in bed. Neurological Exam  awake and alert. Responds to questions.  Dysarthric speech and can be understood with some difficulty.  Follows only occasional midline and one-step commands on the right.  Fixed right gaze preference. Does not cross the midline to the left. Left facial droop.  Dense left hemiplegia 0/5 strength of the left upper and lower extremities. Antigravity motion intact on the right upper and lower extremity. Left hemineglect.  Deep tendon reflexes are elicitable on the right and depressed on the  left.  Right plantar is equivocal left is upgoing.  ASSESSMENT/PLAN Ms. KAYLEENA EKE is a 75 y.o. 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 No further workup or intervention at this time.  Management per primary team.    Hospital day # 0  To contact Stroke Continuity provider, please refer to [email or web address]. After hours, contact General Neurology   Elige Radon, MD Internal Medicine Resident PGY-3 [facility] Pager: [phone] 11/20/2020 12:16 PM    Stroke MD Note:  Patient unfortunately has presented with a massive right hemispheric infarct with complete hypodensity involving the right hemisphere with aspect score of 3 and large core of 185cc.  She is not a candidate for tPA due to significant risk for hemorrhagic transformation of mechanical thrombectomy and the chances of surviving this in making any meaningful recovery are negligible.  I had a long discussion at the bedside with the patient's son who understood the poor prognosis and feels she would not have wanted to be kept alive with artificial nutrition and ventilatory support which may be unavoidable.  He agrees to DNR and comfort care measures only.  Discussed with critical care team and answered questions.This patient is critically ill and at significant risk of neurological worsening, death and care requires constant monitoring of vital signs, hemodynamics,respiratory and cardiac monitoring, extensive review of multiple databases, frequent neurological assessment, discussion with family, other specialists and medical decision making of high complexity.I have made any additions or clarifications directly to the above note.This critical care time does not reflect procedure time, or teaching time or supervisory time of PA/NP/Med Resident etc but could involve care discussion time.  I spent 30 minutes of neurocritical care time  in the care of  this patient.    Delia Heady, MD

## 2020-11-26 NOTE — ED Notes (Signed)
Presents to ed via Teresa Hartman patient was last seen by her son at 1900 last pm , patient states she went to the bathroom at 61 and son found her on the floor of the bathroom , upon arrival Patient is alert  Lots of secretions in mouth, patient was suctioned. Able to maintain airway. Patient is totally out on the left side.  9432  To CT  0730 Back to ED patient status unchanged.

## 2020-11-26 NOTE — ED Notes (Signed)
Attempted report 

## 2020-11-26 NOTE — Consult Note (Signed)
NEURO HOSPITALIST CONSULT NOTE   Requestig physician: Dr. Eudelia Bunch  Reason for Consult: Acute onset of left sided weakness  History obtained from: Patient, EMS and Chart     HPI:                                                                                                                                          Teresa Hartman is an 75 y.o. female presenting from home via EMS as a Code Stroke with left sided weakness and sensory loss. LKN initially encoded as 1900, which was the last time her son saw her normal. Patient told EMS that [facility] was 4:30 AM when she walked to the bathroom. She states that while on the toilet she tried to swat a bug with her right hand and then tipped over. She denies any left sided weakness despite being flaccid on the left. She exhibits anosognosia and left sided neglect. She states that she did not fall, but has bruising and abrasions to the left side of her face. Son found her down in the bathroom and called EMS. On EMS arrival she was weak on the left with left facial droop and rightward gaze deviation, as well as slurred speech. No incontinence or evidence of clinical seizure activity. On arrival to the ED she continued to have left sided deficits in addition to gurgling, drooling from left side of mouth and severe dysarthria with mildly decreased level of consciousness.    CBG 151 per EMS. BP in the ED 125/100.   STAT CT head with ASPECTS of 3 in the context of streak artifact.    CTA with right MCA occlusion. Right ICA is patent to the occlusion.   Past Medical History:  Diagnosis Date   COPD (chronic obstructive pulmonary disease) (HCC)    Thyroid disease    No past surgical history on file.  No family history on file.         Social History:  reports that she does not drink alcohol and does not use drugs. No history on file for tobacco use.  Allergies  Allergen Reactions   Penicillins Rash    HOME MEDICATIONS:  Methotrexate Liothyronine Folate Humera Prednisone (from February prescription) Spireva   ROS:                                                                                                                                       The patient denies left sided weakness. Does not endorse headache.   Weight 115.6 kg.   General Examination:                                                                                                       Physical Exam  HEENT-  Left supraorbital contusion with left facial abrasions.  Lungs: Gurgling but with clear breath sounds Ext: No edema   Neurological Examination Mental Status:  Awake with mildly decreased level of alertness, increased latencies of verbal and motor responses. Left hemineglect and anosognosia. Speech dysarthric but fluent with intact comprehension and naming. Oriented to place and time.  Cranial Nerves: II: PERRL. Left visual field cut.   III,IV, VI: No ptosis. Rightward gaze deviation. Cannot cross midline volitionally to the left, but can be overcome with oculocephalic maneuver.  V,VII: Left facial droop. Insensate on the left.  VIII: Hearing intact to voice IX,X: Pharyngeal dysarthria with occasional gurgling vocalizations.  XI: Head preferentially rotated to the right XII: Leftward tongue deviation Motor: RUE and RLE 5/5 LUE 0/5 with flaccid tone LLE 0/5 with flaccid tone Sensory: FT intact to RUE and RLE. Insensate on the left.  Deep Tendon Reflexes: 1+ bilateral brachioradialis and patellae Plantars: Right: Downgoing   Left: Mute Cerebellar: No ataxia with FNF on the right. Unable to perform on the left.  Gait: Unable to assess   Lab Results: Basic Metabolic Panel: No results for input(s): NA, K, CL, CO2, GLUCOSE, BUN, CREATININE, CALCIUM, MG, PHOS in the last 168 hours.  CBC: No results for input(s): WBC,  NEUTROABS, HGB, HCT, MCV, PLT in the last 168 hours.  Cardiac Enzymes: No results for input(s): CKTOTAL, CKMB, CKMBINDEX, TROPONINI in the last 168 hours.  Lipid Panel: No results for input(s): CHOL, TRIG, HDL, CHOLHDL, VLDL, LDLCALC in the last 168 hours.  Imaging: No results found.  Assessment: 75 year old female presenting as a Code Stroke from home with acute onset of left sided weakness, slurred speech, decreased level of alertness, rightward gaze, left facial droop and left sensory loss. LKN 4:30 AM per patient.  1. CT head: Motion limited study. Findings concerning for acute right MCA territory infarct with loss of gray-white differentiation in  the right basal ganglia and right frontal cortex. ASPECTS is 3. Hyperdense right proximal MCA/carotid terminus, concerning for thrombus. Age indeterminate right PCA territory infarct.  2. CTA of head and neck: Preliminary read by Radiology reveals right ICA terminus occlusion, right MCA occlusion, distal right ACA occlusion.  3. CTP: Infarcted core of 185 cc with penumbra of 121.  4. Not a tPA candidate due to large region of infarcted tissue with unacceptable risk of hemorrhagic conversion.  5. Not an endovascular candidate due to low ASPECTS score of 3.   Recommendations: 1. Will need to be admitted to the ICU as the patient is at risk of rapid deterioration with high likelihood that she will be unable to protect her airway.  2. Hypertonic saline started at 75 cc/hr. 3. Initiation of ASA per stroke team.  4. BP management. Unclear if permissive HTN would be safe due to large volume of infarcted tissue with high risk for hemorrhagic conversion.  5. Frequent neuro checks.  6. Son unreachable by telephone number which was initially called: [phone]. Patient has given me an additional number which should be tried by daytime Stroke Team and/or ICU team: [phone] 7. MRI brain 8. TTE 9. Cardiac telemetry.  10. NPO. Does not appear likely  that she will pass swallow evaluation.  11. Repeat CT head in 6 hours to assess for possible malignant edema (ordered) 12. Discussed with Dr. Pearlean Brownie and EDP Dr. Wilkie Aye.   60 minutes spent in the emergent neurological evaluation and management of this critically ill patient.   Electronically signed: Dr. Caryl Pina 12/16/20, 6:57 AM

## 2020-11-27 DIAGNOSIS — I639 Cerebral infarction, unspecified: Secondary | ICD-10-CM | POA: Diagnosis not present

## 2020-11-27 NOTE — Plan of Care (Signed)

## 2020-11-27 NOTE — Progress Notes (Signed)
This chaplain is responding to spiritual care consult for EOL care.    The chaplain understands the Pt. RN-Camelia will ask the family if they desire a chaplain' s presence and secure chat the chaplain.

## 2020-11-27 NOTE — Progress Notes (Signed)
Called to room by nurse to evaluate patient for possible NT suction.  Patient has persistent gurgling, large amounts of thin creamy yellow secretions, ineffective and weak cough.  Sats are 96% on 2L [place].  The back of the patients's throat was suctioned for large amounts afore mentioned secretions.  NT suction is not recommended at this time due to possible trauma and discomfort.  RN at bedside, patient's family was informed.

## 2020-11-27 NOTE — Care Management (Addendum)
Verbal consult for residential hospice.   NCM spoke to Eben Burow with [facility], [facility] and [facility] would be unable to accept patient until she was off isolation precautions for covid ( 10 days passed testing positive, patient tested positive 05/29/20).  Spoke to [employer] with [facility] , they can accept covid positive patients if patient meets criteria .   Called patient's son Dallie Piles [phone], no answer and no voicemail. Called patient's room , spoke to [place] .   Explained above. Chrissie Noa lives in [place] area. NCM will check on [facility].   Spoke to [place] at [facility], patient would need to be 20 days post testing positive , for them to accept.   Talked to [place] , explained above. He consented for referral to [facility].  William's cell [phone], his daughter's cell Katlyn [phone]  Referral given to [facility] with [facility]. She will review referral and call NCM back with determination.

## 2020-11-27 NOTE — Progress Notes (Signed)
[facility] PROGRESS NOTE    Progress Note  Teresa Hartman  ZWC:[id number] DOB: 03/27/1946 DOA: 11/11/2020 PCP: Oneita Hurt, No     Brief Narrative:   Teresa Hartman is an 75 y.o. female past medical history of COPD presents EMS via code stroke with left-sided weakness and sensory loss of the head showed massive right complete MCA infarct not a candidate for tPA or endovascular thrombectomy due to the extensive territory coverage.  Neurology has been consulted who deemed her not a candidate for mechanical thrombectomy as chances of surviving and making a meaningful recovery are negligible.  He had a long discussion with the son who was at bedside who understood the poor prognosis, he decided to make her a DNR and comfort measures.  He does not want her on life support.  Assessment/Plan:   Active Problems:   Cerebral thrombosis with cerebral infarction   Stroke (cerebrum) [facility])  Extensive acute right MCA CVA: Aspect 3 on CT imaging with left hemineglect.  She is at very high risk of progressive edema and herniation. After having a long discussion with the son he decided to move towards comfort care. She was started on morphine and Ativan as needed. Made DNR/DNI. We have consulted TOC to try to place her to residential hospice facility. Family at bedside and would like to continue to move forward to a residential hospice facility.   DVT prophylaxis: none Family Communication:son Status is: Inpatient  Remains inpatient appropriate because:Hemodynamically unstable  Dispo: The patient is from: Home              Anticipated d/c is to:  Residential hospice              Patient currently is not medically stable to d/c.   Difficult to place patient No       Code Status:     Code Status Orders  (From admission, onward)           Start     Ordered   11/18/2020 1006  DNR (Do not attempt resuscitation)  Continuous       Question Answer Comment  In the event of cardiac or  respiratory ARREST Do not call a "code blue"   In the event of cardiac or respiratory ARREST Do not perform Intubation, CPR, defibrillation or ACLS   In the event of cardiac or respiratory ARREST Use medication by any route, position, wound care, and other measures to relive pain and suffering. May use oxygen, suction and manual treatment of airway obstruction as needed for comfort.   Comments DNR / DNI - if fails, transition to comfort measures      11/21/2020 1006           Code Status History     Date Active Date Inactive Code Status Order ID Comments User Context   12/06/2020 0910 11/22/2020 1006 DNR [id number]  Jeanella Craze, NP ED         IV Access:   Peripheral IV   Procedures and diagnostic studies:   CT HEAD CODE STROKE WO CONTRAST  Result Date: 12/08/2020 CLINICAL DATA:  Code stroke. EXAM: CT HEAD WITHOUT CONTRAST TECHNIQUE: Contiguous axial images were obtained from the base of the skull through the vertex without intravenous contrast. COMPARISON:  None. FINDINGS: Brain: Motion limited study. There is suggestion of abnormal loss of gray-white differentiation involving the right insula, right caudate, right putamen, and the right frontal cortex, including right proximal MCA territory. The distal right MCA  territory may have preserved gray-white. There also is hypoattenuation and loss of gray differentiation right PCA territory including the parahippocampal right temporal lobe and right occipital lobe. No evidence of acute hemorrhage. No hydrocephalus. No mass lesion or midline shift. Basal cisterns are patent. Vascular: Hyperdense right proximal MCA/carotid terminus, concerning thrombus. Skull: No acute fracture. Sinuses/Orbits: Mucosal thickening of the scattered sinuses Other: No mastoid effusions. ASPECTS [facility]([place] Stroke Program Early CT Score) - Ganglionic level infarction (caudate, lentiform nuclei, internal capsule, insula, M1-M3 cortex): 2 - Supraganglionic infarction  (M4-M6 cortex): 1 Total score (0-10 with 10 being normal): 3 IMPRESSION: 1. Motion limited study. Findings concerning for acute right MCA territory infarct with loss of gray-white differentiation in the right basal ganglia and right frontal cortex, as detailed above. ASPECTS is 3 2. Hyperdense right proximal MCA/carotid terminus, concerning for thrombus. Please see forthcoming CTA for further evaluation. 3. Age indeterminate right PCA territory infarct. Recommend MRI to further characterize. 4. Paranasal sinus mucosal thickening. Code stroke imaging results were communicated on 2020-07-01 at 7:21 am to provider Dr. Otelia LimesLindzen via 7:07 a.m. Electronically Signed   By: Feliberto HartsFrederick S Jones MD   On: 02022-02-20 07:29   CT ANGIO HEAD NECK W WO CM W PERF (CODE STROKE)  Result Date: 2020-07-01 EXAM: CT ANGIOGRAPHY HEAD AND NECK CT PERFUSION BRAIN TECHNIQUE: Multidetector CT imaging of the head and neck was performed using the standard protocol during bolus administration of intravenous contrast. Multiplanar CT image reconstructions and MIPs were obtained to evaluate the vascular anatomy. Carotid stenosis measurements (when applicable) are obtained utilizing NASCET criteria, using the distal internal carotid diameter as the denominator. Multiphase CT imaging of the brain was performed following IV bolus contrast injection. Subsequent parametric perfusion maps were calculated using RAPID software. CONTRAST:  140mL OMNIPAQUE IOHEXOL 350 MG/ML SOLN COMPARISON:  None. FINDINGS: CTA NECK FINDINGS Aortic arch: Atherosclerosis.  Great vessel origins are patent. Right carotid system: Mixed calcific and noncalcific atherosclerosis at the carotid bifurcation. No evidence of greater than 50% stenosis relative to the distal vessel. Left carotid system: Calcific and noncalcific atherosclerosis without evidence of greater than 50% stenosis relative to the distal vessel. Vertebral arteries: Motion limited evaluation. Suspected severe  stenosis of the right vertebral artery origin. Left vertebral artery is patent without significant stenosis. Skeleton: No acute abnormality. Other neck: No acute abnormality. Upper chest: Emphysema. Review of the MIP images confirms the above findings CTA HEAD FINDINGS Anterior circulation: Poorly opacified right intracranial ICA with non opacification of the right ICA terminus, compatible with occlusion. Faint opacification of the distal right M1 MCA and proximal M2 MCA branches. The right A1 ACA is opacified, likely from retrograde flow. Left MCA is patent. Suspected severe stenosis versus occlusion of a distal right ACA branch. Posterior circulation: Severe right intradural vertebral artery stenosis. Left vertebral artery is patent. The basilar artery and bilateral visualized posterior cerebral arteries are patent. Venous sinuses: Dural venous sinuses are patent. Review of the MIP images confirms the above findings CT Brain Perfusion Findings: ASPECTS: 3 CBF (<30%) Volume: 185mL Perfusion (Tmax>6.0s) volume: 306mL Mismatch Volume: 121mL Infarction Location:Right ACA and MCA territories. IMPRESSION: CTA Head: 1. Right ICA terminus occlusion with faint opacification of distal right M1 and M2 MCA branches. 2. Suspected occlusion of a distal right ACA branch. 3. Severe right intradural vertebral artery stenosis. CT Perfusion: Large core infarct involving the right MCA and ACA territories (185 mL) with approximately 121 mL of detected surrounding penumbra. CTA Neck: 1. Bilateral carotid bifurcation atherosclerosis without greater  than 50% stenosis. 2. Limited evaluation of the vertebral artery origins with suspected severe right vertebral artery origin stenosis. Findings discussed with Dr. Otelia Limes via telephone at 7:07 a.m. Electronically Signed   By: Feliberto Harts MD   On: 2020-12-15 07:46     Medical Consultants:   None.   Subjective:    Teresa Hartman minimally verbal  Objective:    Vitals:    15-Dec-2020 2030 12-15-20 2143 December 15, 2020 2144 11/27/20 0616  BP: 137/78 (!) 147/88  130/76  Pulse: 76 81  97  Resp: (!) 21     Temp:   97.8 F (36.6 C) 97.6 F (36.4 C)  TempSrc:   Axillary Axillary  SpO2: 100%   97%  Weight:      Height:       SpO2: 97 % O2 Flow Rate (L/min): 2 L/min   Intake/Output Summary (Last 24 hours) at 11/27/2020 0741 Last data filed at 11/27/2020 0714 Gross per 24 hour  Intake 188.07 ml  Output 400 ml  Net -211.93 ml   Filed Weights   15-Dec-2020 0600 2020/12/15 0731  Weight: 115.6 kg 115.6 kg    Exam: General exam: In no acute distress. Respiratory system: Good air movement and clear to auscultation. Cardiovascular system: S1 & S2 heard, RRR. No JVD. Gastrointestinal system: Abdomen is nondistended, soft and nontender.  Extremities: No pedal edema. Skin: No rashes, lesions or ulcers   Data Reviewed:    Labs: Basic Metabolic Panel: Recent Labs  Lab December 15, 2020 0644 12/15/2020 0657 2020/12/15 0747  NA 136 138 129*  K 3.2* 3.2*  --   CL 98 98  --   CO2 28  --   --   GLUCOSE 154* 151*  --   BUN 8 10  --   CREATININE 0.79 0.70  --   CALCIUM 8.6*  --   --    GFR Estimated Creatinine Clearance: 76 mL/min (by C-G formula based on SCr of 0.7 mg/dL). Liver Function Tests: Recent Labs  Lab 15-Dec-2020 0644  AST 19  ALT 14  ALKPHOS 114  BILITOT 1.3*  PROT 6.3*  ALBUMIN 3.3*   No results for input(s): LIPASE, AMYLASE in the last 168 hours. No results for input(s): AMMONIA in the last 168 hours. Coagulation profile Recent Labs  Lab Dec 15, 2020 0644  INR 1.0   COVID-19 Labs  No results for input(s): DDIMER, FERRITIN, LDH, CRP in the last 72 hours.  Lab Results  Component Value Date   SARSCOV2NAA POSITIVE (A) December 15, 2020    CBC: Recent Labs  Lab Dec 15, 2020 0644 12/15/2020 0657  WBC 6.6  --   NEUTROABS 4.6  --   HGB 13.7 13.6  HCT 40.9 40.0  MCV 96.5  --   PLT 277  --    Cardiac Enzymes: No results for input(s): CKTOTAL, CKMB, CKMBINDEX,  TROPONINI in the last 168 hours. BNP (last 3 results) No results for input(s): PROBNP in the last 8760 hours. CBG: Recent Labs  Lab December 15, 2020 0651  GLUCAP 151*   D-Dimer: No results for input(s): DDIMER in the last 72 hours. Hgb A1c: No results for input(s): HGBA1C in the last 72 hours. Lipid Profile: No results for input(s): CHOL, HDL, LDLCALC, TRIG, CHOLHDL, LDLDIRECT in the last 72 hours. Thyroid function studies: No results for input(s): TSH, T4TOTAL, T3FREE, THYROIDAB in the last 72 hours.  Invalid input(s): FREET3 Anemia work up: No results for input(s): VITAMINB12, FOLATE, FERRITIN, TIBC, IRON, RETICCTPCT in the last 72 hours. Sepsis Labs: Recent Labs  Lab 11/15/2020 0644  WBC 6.6   Microbiology Recent Results (from the past 240 hour(s))  Resp Panel by RT-PCR (Flu A&B, Covid) Nasopharyngeal Swab     Status: Abnormal   Collection Time: 11/28/2020  6:45 AM   Specimen: Nasopharyngeal Swab; Nasopharyngeal(NP) swabs in vial transport medium  Result Value Ref Range Status   SARS Coronavirus 2 by RT PCR POSITIVE (A) NEGATIVE Final    Comment: RESULT CALLED TO, READ BACK BY AND VERIFIED WITH: RN K.MOON ON [id number] AT 0922 BY E.PARRISH (NOTE) SARS-CoV-2 target nucleic acids are DETECTED.  The SARS-CoV-2 RNA is generally detectable in upper respiratory specimens during the acute phase of infection. Positive results are indicative of the presence of the identified virus, but do not rule out bacterial infection or co-infection with other pathogens not detected by the test. Clinical correlation with patient history and other diagnostic information is necessary to determine patient infection status. The expected result is Negative.  Fact Sheet for Patients: BloggerCourse.com  Fact Sheet for Healthcare Providers: SeriousBroker.it  This test is not yet approved or cleared by the [place] FDA and  has been authorized for  detection and/or diagnosis of SARS-CoV-2 by FDA under an Emergency Use Authorization (EUA).  This EUA will remain in effect (meaning this te st can be used) for the duration of  the COVID-19 declaration under Section 564(b)(1) of the Act, 21 U.S.C. section 360bbb-3(b)(1), unless the authorization is terminated or revoked sooner.     Influenza A by PCR NEGATIVE NEGATIVE Final   Influenza B by PCR NEGATIVE NEGATIVE Final    Comment: (NOTE) The Xpert Xpress SARS-CoV-2/FLU/RSV plus assay is intended as an aid in the diagnosis of influenza from Nasopharyngeal swab specimens and should not be used as a sole basis for treatment. Nasal washings and aspirates are unacceptable for Xpert Xpress SARS-CoV-2/FLU/RSV testing.  Fact Sheet for Patients: BloggerCourse.com  Fact Sheet for Healthcare Providers: SeriousBroker.it  This test is not yet approved or cleared by the [place] FDA and has been authorized for detection and/or diagnosis of SARS-CoV-2 by FDA under an Emergency Use Authorization (EUA). This EUA will remain in effect (meaning this test can be used) for the duration of the COVID-19 declaration under Section 564(b)(1) of the Act, 21 U.S.C. section 360bbb-3(b)(1), unless the authorization is terminated or revoked.  Performed at [facility], [address] [place], [place] [address]      Medications:    sodium chloride flush  3 mL Intravenous Once   Continuous Infusions:    LOS: 1 day   Marinda Elk  [facility]  11/27/2020, 7:41 AM

## 2020-11-27 NOTE — TOC CAGE-AID Note (Signed)
Transition of Care [facility]) - CAGE-AID Screening   Patient Details  Name: Teresa Hartman MRN: [id number] Date of Birth: [birth year]  [facility]) CM/SW Contact:    Breannah Kratt C Tarpley-Carter, LCSWA Phone Number: 11/27/2020, 9:39 AM   Clinical Narrative: Pt is unable to participate in Cage Aid. Pt is currently receiving comfort care.  Kynadi Dragos Tarpley-Carter, MSW, LCSW-A Pronouns:  She/Her/Hers [facility] Clinical Social Worker Direct Number:  [phone] [email or web address]   CAGE-AID Screening: Substance Abuse Screening unable to be completed due to: : Patient unable to participate ((Pt is under comfort care.))

## 2020-11-27 NOTE — Progress Notes (Signed)
STROKE TEAM PROGRESS NOTE   INTERVAL HISTORY Son is at bedside.  Patient has not been made full comfort care and is resting peacefully.  Family wants her to be moved to hospice nursing home and case manager is working out the details. Vitals:   11/22/2020 2143 12/09/2020 2144 11/27/20 0616 11/27/20 1300  BP: (!) 147/88  130/76 137/74  Pulse: 81  97 (!) 108  Resp:    17  Temp:  97.8 F (36.6 C) 97.6 F (36.4 C)   TempSrc:  Axillary Axillary   SpO2:   97% 98%  Weight:      Height:       CBC:  Recent Labs  Lab 11/20/2020 0644 11/23/2020 0657  WBC 6.6  --   NEUTROABS 4.6  --   HGB 13.7 13.6  HCT 40.9 40.0  MCV 96.5  --   PLT 277  --    Basic Metabolic Panel:  Recent Labs  Lab 11/18/2020 0644 12/03/2020 0657 12/02/2020 0747  NA 136 138 129*  K 3.2* 3.2*  --   CL 98 98  --   CO2 28  --   --   GLUCOSE 154* 151*  --   BUN 8 10  --   CREATININE 0.79 0.70  --   CALCIUM 8.6*  --   --    Lipid Panel: No results for input(s): CHOL, TRIG, HDL, CHOLHDL, VLDL, LDLCALC in the last 168 hours. HgbA1c: No results for input(s): HGBA1C in the last 168 hours. Urine Drug Screen: No results for input(s): LABOPIA, COCAINSCRNUR, LABBENZ, AMPHETMU, THCU, LABBARB in the last 168 hours.  Alcohol Level No results for input(s): ETH in the last 168 hours.  CT HEAD CODE STROKE WO CONTRAST Acute right MCA territory infarct with loss of gray-white differentiation in the right basal ganglia and right frontal cortex.  Aspects 3.  Hyperdense right proximal MCA/carotid terminus concerning for thrombus.  Age-indeterminate right PCA territory infarct.  CTA head and neck Right ICA terminus occlusion with faint opacification of distal right M1 and M2 MCA branches.  Suspected occlusion of a distal right ACA branch.  Severe right intradural vertebral artery stenosis.  CT perfusion scan Core infarct involving the right MCA and ACA territories (185 mL) with approximately 121 mL of detected surrounding penumbra  PHYSICAL  EXAM General: chronically ill appearing elderly lady lying comfortably in bed. Neurological Exam  Drowsy but can be aroused..  Dysarthric speech and can be understood with some difficulty.  Follows only occasional midline and one-step commands on the right.  Fixed right gaze preference. Does not cross the midline to the left. Left facial droop.  Dense left hemiplegia 0/5 strength of the left upper and lower extremities. Antigravity motion intact on the right upper and lower extremity. Left hemineglect.  Deep tendon reflexes are elicitable on the right and depressed on the left.  Right plantar is equivocal left is upgoing.  ASSESSMENT/PLAN Ms. Teresa Hartman is a 75 y.o. 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 No further workup or  intervention at this time. Management per primary team.      Patient unfortunately has presented with a massive right hemispheric infarct with complete hypodensity involving the right hemisphere with aspect score of 3 and large core of 185cc.  She is not a candidate for tPA due to significant risk for hemorrhagic transformation of mechanical thrombectomy and the chances of surviving this in making any meaningful recovery are negligible.  I had a long discussion at the bedside with the patient's son who understood the poor prognosis and feels she would not have wanted to be kept alive with artificial nutrition and ventilatory support which may be  unavoidable.  He agrees to DNR and comfort care measures only.  Agree with transfer to hospice nursing home when bed available.  Stroke team will sign off.  Kindly call for questions.    Delia Heady, MD

## 2020-11-27 NOTE — Progress Notes (Signed)
Nutrition Brief Note  Chart reviewed. Pt now transitioning to comfort care.  No further nutrition interventions planned at this time.  Please re-consult as needed.   Etrulia Zarr W, RD, LDN, CDCES Registered Dietitian II Certified Diabetes Care and Education Specialist Please refer to AMION for RD and/or RD on-call/weekend/after hours pager   

## 2020-11-27 NOTE — Plan of Care (Signed)
  Problem: Education: Goal: Knowledge of General Education information will improve Description: Including pain rating scale, medication(s)/side effects and non-pharmacologic comfort measures Outcome: Progressing   Problem: Health Behavior/Discharge Planning: Goal: Ability to manage health-related needs will improve Outcome: Progressing   Problem: Clinical Measurements: Goal: Ability to maintain clinical measurements within normal limits will improve Outcome: Progressing Goal: Will remain free from infection Outcome: Progressing Goal: Diagnostic test results will improve Outcome: Progressing Goal: Respiratory complications will improve Outcome: Progressing Goal: Cardiovascular complication will be avoided Outcome: Progressing   Problem: Activity: Goal: Risk for activity intolerance will decrease Outcome: Not Applicable   Problem: Nutrition: Goal: Adequate nutrition will be maintained Outcome: Not Applicable   Problem: Coping: Goal: Level of anxiety will decrease Outcome: Progressing   Problem: Elimination: Goal: Will not experience complications related to bowel motility Outcome: Progressing Goal: Will not experience complications related to urinary retention Outcome: Progressing   Problem: Pain Managment: Goal: General experience of comfort will improve Outcome: Progressing   Problem: Safety: Goal: Ability to remain free from injury will improve Outcome: Progressing

## 2020-11-27 NOTE — Progress Notes (Signed)
RT called and asked to assess breathing and suctioning that may benefit the patient> Ilean Skill LPN

## 2020-11-28 DIAGNOSIS — I639 Cerebral infarction, unspecified: Secondary | ICD-10-CM | POA: Diagnosis not present

## 2020-11-28 MED ORDER — MORPHINE 100MG IN NS 100ML (1MG/ML) PREMIX INFUSION
2.0000 mg/h | INTRAVENOUS | Status: DC
  Administered 2020-11-28: 2 mg/h via INTRAVENOUS
  Filled 2020-11-28: qty 100

## 2020-11-28 MED ORDER — POLYVINYL ALCOHOL 1.4 % OP SOLN: 1.0000 [drp] | Freq: Four times a day (QID) | OPHTHALMIC | Status: DC | PRN

## 2020-11-28 MED ORDER — WHITE PETROLATUM EX OINT
TOPICAL_OINTMENT | CUTANEOUS | Status: AC
  Filled 2020-11-28: qty 28.35

## 2020-11-28 MED ORDER — BIOTENE DRY MOUTH MT LIQD: 15.0000 mL | OROMUCOSAL | Status: DC | PRN

## 2020-11-28 NOTE — Plan of Care (Signed)

## 2020-11-28 NOTE — Progress Notes (Signed)
[facility] PROGRESS NOTE    Progress Note  Teresa Hartman  WLS:[id number] DOB: [birth year] DOA: 12/05/2020 PCP: Oneita Hurt, No     Brief Narrative:   Teresa Hartman is an 75 y.o. female past medical history of COPD presents EMS via code stroke with left-sided weakness and sensory loss of the head showed massive right complete MCA infarct not a candidate for tPA or endovascular thrombectomy due to the extensive territory coverage.  Neurology has been consulted who deemed her not a candidate for mechanical thrombectomy as chances of surviving and making a meaningful recovery are negligible.  He had a long discussion with the son who was at bedside who understood the poor prognosis, he decided to make her a DNR and comfort measures.  He does not want her on life support.  Assessment/Plan:   Active Problems:   Cerebral thrombosis with cerebral infarction   Stroke (cerebrum) [facility])  Extensive acute right MCA CVA: Aspect 3 on CT imaging with left hemineglect.  She is at very high risk of progressive edema and herniation. long discussion with the son he decided to move towards comfort care.DNR/DNI currently Morphine gtt. started given shallow breaths and increased work of breathing We have consulted TOC to try to place her to residential hospice facility. Family at bedside and would like to continue to move forward to a residential hospice facility.   DVT prophylaxis: none Family Communication:son Status is: Inpatient  Remains inpatient appropriate because:Hemodynamically unstable  Dispo: The patient is from: Home              Anticipated d/c is to:  Residential hospice              Patient currently is not medically stable to d/c.   Difficult to place patient No       Code Status:     Code Status Orders  (From admission, onward)           Start     Ordered   11/18/2020 1006  DNR (Do not attempt resuscitation)  Continuous       Question Answer Comment  In the event of  cardiac or respiratory ARREST Do not call a "code blue"   In the event of cardiac or respiratory ARREST Do not perform Intubation, CPR, defibrillation or ACLS   In the event of cardiac or respiratory ARREST Use medication by any route, position, wound care, and other measures to relive pain and suffering. May use oxygen, suction and manual treatment of airway obstruction as needed for comfort.   Comments DNR / DNI - if fails, transition to comfort measures      11/30/2020 1006           Code Status History     Date Active Date Inactive Code Status Order ID Comments User Context   11/18/2020 0910 11/20/2020 1006 DNR [id number]  Jeanella Craze, NP ED         IV Access:   Peripheral IV   Procedures and diagnostic studies:   No results found.   Medical Consultants:   None.   Subjective:    Oneita Kras minimally verbal No distress but breathing shallowly-not responsive Multiple family members bedside I do not know if she will survive the next 24 hours and I voiced this to the family present at the bedside (son, granddaughter) who have family coming into visit from out of town  Objective:    Vitals:   11/27/20 1300 11/27/20 1641 11/27/20  2128 11/28/20 0900  BP: 137/74 127/64 (!) 141/61 126/68  Pulse: (!) 108 (!) 112 (!) 120 (!) 120  Resp: 20 (!) 34    Temp:   98.5 F (36.9 C) 98 F (36.7 C)  TempSrc:   Oral Oral  SpO2: 98% 93% (!) 82% 90%  Weight:      Height:       SpO2: 90 % O2 Flow Rate (L/min): 2 L/min   Intake/Output Summary (Last 24 hours) at 11/28/2020 1521 Last data filed at 11/28/2020 1500 Gross per 24 hour  Intake 8.59 ml  Output 1075 ml  Net -1066.41 ml    Filed Weights   11/16/2020 0600 11/20/2020 0731  Weight: 115.6 kg 115.6 kg    Exam:  Shallow respirations Capillary refill intact Pupils reactive but somewhat miotic Abdomen soft no rebound no guarding ROM intact Chest clear no rales   Data Reviewed:    Labs: Basic Metabolic  Panel: Recent Labs  Lab 11/18/2020 0644 11/15/2020 0657 11/11/2020 0747  NA 136 138 129*  K 3.2* 3.2*  --   CL 98 98  --   CO2 28  --   --   GLUCOSE 154* 151*  --   BUN 8 10  --   CREATININE 0.79 0.70  --   CALCIUM 8.6*  --   --     GFR Estimated Creatinine Clearance: 76 mL/min (by C-G formula based on SCr of 0.7 mg/dL). Liver Function Tests: Recent Labs  Lab 11/25/2020 0644  AST 19  ALT 14  ALKPHOS 114  BILITOT 1.3*  PROT 6.3*  ALBUMIN 3.3*    No results for input(s): LIPASE, AMYLASE in the last 168 hours. No results for input(s): AMMONIA in the last 168 hours. Coagulation profile Recent Labs  Lab 11/19/2020 0644  INR 1.0    COVID-19 Labs  No results for input(s): DDIMER, FERRITIN, LDH, CRP in the last 72 hours.  Lab Results  Component Value Date   SARSCOV2NAA POSITIVE (A) 11/25/2020    CBC: Recent Labs  Lab 11/15/2020 0644 12/07/2020 0657  WBC 6.6  --   NEUTROABS 4.6  --   HGB 13.7 13.6  HCT 40.9 40.0  MCV 96.5  --   PLT 277  --     Cardiac Enzymes: No results for input(s): CKTOTAL, CKMB, CKMBINDEX, TROPONINI in the last 168 hours. BNP (last 3 results) No results for input(s): PROBNP in the last 8760 hours. CBG: Recent Labs  Lab 12/08/2020 0651  GLUCAP 151*    D-Dimer: No results for input(s): DDIMER in the last 72 hours. Hgb A1c: No results for input(s): HGBA1C in the last 72 hours. Lipid Profile: No results for input(s): CHOL, HDL, LDLCALC, TRIG, CHOLHDL, LDLDIRECT in the last 72 hours. Thyroid function studies: No results for input(s): TSH, T4TOTAL, T3FREE, THYROIDAB in the last 72 hours.  Invalid input(s): FREET3 Anemia work up: No results for input(s): VITAMINB12, FOLATE, FERRITIN, TIBC, IRON, RETICCTPCT in the last 72 hours. Sepsis Labs: Recent Labs  Lab 11/21/2020 0644  WBC 6.6    Microbiology Recent Results (from the past 240 hour(s))  Resp Panel by RT-PCR (Flu A&B, Covid) Nasopharyngeal Swab     Status: Abnormal   Collection Time:  11/11/2020  6:45 AM   Specimen: Nasopharyngeal Swab; Nasopharyngeal(NP) swabs in vial transport medium  Result Value Ref Range Status   SARS Coronavirus 2 by RT PCR POSITIVE (A) NEGATIVE Final    Comment: RESULT CALLED TO, READ BACK BY AND VERIFIED WITH: RN  K.MOON ON [id number] AT 0922 BY E.PARRISH (NOTE) SARS-CoV-2 target nucleic acids are DETECTED.  The SARS-CoV-2 RNA is generally detectable in upper respiratory specimens during the acute phase of infection. Positive results are indicative of the presence of the identified virus, but do not rule out bacterial infection or co-infection with other pathogens not detected by the test. Clinical correlation with patient history and other diagnostic information is necessary to determine patient infection status. The expected result is Negative.  Fact Sheet for Patients: BloggerCourse.com  Fact Sheet for Healthcare Providers: SeriousBroker.it  This test is not yet approved or cleared by the [place] FDA and  has been authorized for detection and/or diagnosis of SARS-CoV-2 by FDA under an Emergency Use Authorization (EUA).  This EUA will remain in effect (meaning this te st can be used) for the duration of  the COVID-19 declaration under Section 564(b)(1) of the Act, 21 U.S.C. section 360bbb-3(b)(1), unless the authorization is terminated or revoked sooner.     Influenza A by PCR NEGATIVE NEGATIVE Final   Influenza B by PCR NEGATIVE NEGATIVE Final    Comment: (NOTE) The Xpert Xpress SARS-CoV-2/FLU/RSV plus assay is intended as an aid in the diagnosis of influenza from Nasopharyngeal swab specimens and should not be used as a sole basis for treatment. Nasal washings and aspirates are unacceptable for Xpert Xpress SARS-CoV-2/FLU/RSV testing.  Fact Sheet for Patients: BloggerCourse.com  Fact Sheet for Healthcare  Providers: SeriousBroker.it  This test is not yet approved or cleared by the [place] FDA and has been authorized for detection and/or diagnosis of SARS-CoV-2 by FDA under an Emergency Use Authorization (EUA). This EUA will remain in effect (meaning this test can be used) for the duration of the COVID-19 declaration under Section 564(b)(1) of the Act, 21 U.S.C. section 360bbb-3(b)(1), unless the authorization is terminated or revoked.  Performed at [facility], [address] [place], [place] [address]      Medications:    sodium chloride flush  3 mL Intravenous Once   Continuous Infusions:  morphine 2 mg/hr (11/28/20 1038)      LOS: 2 days   Jai-Gurmukh Breaunna Gottlieb  [facility]  11/28/2020, 3:21 PM

## 2020-12-10 NOTE — Progress Notes (Signed)
OVERNIGHT PROGRESS REPORT  Notified by RN that patient has expired at   25. Patient was comfort care  2 RN verified.  Family was available to RN.   Chinita Greenland MSNA ACNPC-AG Acute Care Nurse Practitioner [facility]

## 2020-12-10 NOTE — Progress Notes (Signed)
Pt expired at 0447, death pronounced by 2Rns. Family at bedside. Message sent to J. Garner Nash, NP at 0503 and [facility] was notified of this death. Morphine 1mg /ml, 25ml wasted with 20m, Rn. All personal belongings brought home by son.

## 2020-12-10 NOTE — Discharge Summary (Signed)
Death Summary  Teresa Hartman IOM:[id number] DOB: [birth year] DOA: 12/22/2020  PCP: Teresa Hartman, No  Admit date: 12-22-20 Date of Death: 12-25-20 Time of Death: 4:47 AM Notification: Pcp, No notified of death of 12/25/20   History of present illness:  Teresa Hartman is a 75 y.o. female with a history of COPD Teresa Hartman presented with complaint of left sensory weakness and sensory loss in the head Initial diagnostics showed massive right complete MCA infarct-not deemed tPA or endovascular thrombectomy candidate due to the extensive edema of the coverage of the territory Neurology saw the patient and deemed survival and meaningful recovery negligible/minimal Teresa Kras did not improve after being admitted to the hospital and patient was placed initially on comfort measures with intermittent morphine  Subsequently patient was placed on continuous morphine gtt. Eventually succumbed to acute respiratory failure from failure to protect airway secondary to massive stroke at 4:47 AM  Final Diagnoses:  Acute respiratory failure Right complete extensive MCA infarct      The results of significant diagnostics from this hospitalization (including imaging, microbiology, ancillary and laboratory) are listed below for reference.    Significant Diagnostic Studies: CT HEAD CODE STROKE WO CONTRAST  Result Date: Dec 22, 2020 CLINICAL DATA:  Code stroke. EXAM: CT HEAD WITHOUT CONTRAST TECHNIQUE: Contiguous axial images were obtained from the base of the skull through the vertex without intravenous contrast. COMPARISON:  None. FINDINGS: Brain: Motion limited study. There is suggestion of abnormal loss of gray-white differentiation involving the right insula, right caudate, right putamen, and the right frontal cortex, including right proximal MCA territory. The distal right MCA territory may have preserved gray-white. There also is hypoattenuation and loss of gray differentiation right PCA territory  including the parahippocampal right temporal lobe and right occipital lobe. No evidence of acute hemorrhage. No hydrocephalus. No mass lesion or midline shift. Basal cisterns are patent. Vascular: Hyperdense right proximal MCA/carotid terminus, concerning thrombus. Skull: No acute fracture. Sinuses/Orbits: Mucosal thickening of the scattered sinuses Other: No mastoid effusions. ASPECTS [facility] Stroke Program Early CT Score) - Ganglionic level infarction (caudate, lentiform nuclei, internal capsule, insula, M1-M3 cortex): 2 - Supraganglionic infarction (M4-M6 cortex): 1 Total score (0-10 with 10 being normal): 3 IMPRESSION: 1. Motion limited study. Findings concerning for acute right MCA territory infarct with loss of gray-white differentiation in the right basal ganglia and right frontal cortex, as detailed above. ASPECTS is 3 2. Hyperdense right proximal MCA/carotid terminus, concerning for thrombus. Please see forthcoming CTA for further evaluation. 3. Age indeterminate right PCA territory infarct. Recommend MRI to further characterize. 4. Paranasal sinus mucosal thickening. Code stroke imaging results were communicated on 2020-12-22 at 7:21 am to provider Dr. Otelia Limes via 7:07 a.m. Electronically Signed   By: Feliberto Harts MD   On: 12/22/2020 07:29   CT ANGIO HEAD NECK W WO CM W PERF (CODE STROKE)  Result Date: 12/22/20 EXAM: CT ANGIOGRAPHY HEAD AND NECK CT PERFUSION BRAIN TECHNIQUE: Multidetector CT imaging of the head and neck was performed using the standard protocol during bolus administration of intravenous contrast. Multiplanar CT image reconstructions and MIPs were obtained to evaluate the vascular anatomy. Carotid stenosis measurements (when applicable) are obtained utilizing NASCET criteria, using the distal internal carotid diameter as the denominator. Multiphase CT imaging of the brain was performed following IV bolus contrast injection. Subsequent parametric perfusion maps were calculated  using RAPID software. CONTRAST:  OMNIPAQUE IOHEXOL 350 MG/ML SOLN COMPARISON:  None. FINDINGS: CTA NECK FINDINGS Aortic arch: Atherosclerosis.  Great vessel  origins are patent. Right carotid system: Mixed calcific and noncalcific atherosclerosis at the carotid bifurcation. No evidence of greater than 50% stenosis relative to the distal vessel. Left carotid system: Calcific and noncalcific atherosclerosis without evidence of greater than 50% stenosis relative to the distal vessel. Vertebral arteries: Motion limited evaluation. Suspected severe stenosis of the right vertebral artery origin. Left vertebral artery is patent without significant stenosis. Skeleton: No acute abnormality. Other neck: No acute abnormality. Upper chest: Emphysema. Review of the MIP images confirms the above findings CTA HEAD FINDINGS Anterior circulation: Poorly opacified right intracranial ICA with non opacification of the right ICA terminus, compatible with occlusion. Faint opacification of the distal right M1 MCA and proximal M2 MCA branches. The right A1 ACA is opacified, likely from retrograde flow. Left MCA is patent. Suspected severe stenosis versus occlusion of a distal right ACA branch. Posterior circulation: Severe right intradural vertebral artery stenosis. Left vertebral artery is patent. The basilar artery and bilateral visualized posterior cerebral arteries are patent. Venous sinuses: Dural venous sinuses are patent. Review of the MIP images confirms the above findings CT Brain Perfusion Findings: ASPECTS: 3 CBF (<30%) Volume: Perfusion (Tmax>6.0s) volume: Mismatch Volume: Infarction Location:Right ACA and MCA territories. IMPRESSION: CTA Head: 1. Right ICA terminus occlusion with faint opacification of distal right M1 and M2 MCA branches. 2. Suspected occlusion of a distal right ACA branch. 3. Severe right intradural vertebral artery stenosis. CT Perfusion: Large core infarct involving the right MCA and  ACA territories (185 mL) with approximately 121 mL of detected surrounding penumbra. CTA Neck: 1. Bilateral carotid bifurcation atherosclerosis without greater than 50% stenosis. 2. Limited evaluation of the vertebral artery origins with suspected severe right vertebral artery origin stenosis. Findings discussed with Dr. Otelia Limes via telephone at 7:07 a.m. Electronically Signed   By: Feliberto Harts MD   On: 11/22/2020 07:46    Microbiology: Recent Results (from the past 240 hour(s))  Resp Panel by RT-PCR (Flu A&B, Covid) Nasopharyngeal Swab     Status: Abnormal   Collection Time: 11/18/2020  6:45 AM   Specimen: Nasopharyngeal Swab; Nasopharyngeal(NP) swabs in vial transport medium  Result Value Ref Range Status   SARS Coronavirus 2 by RT PCR POSITIVE (A) NEGATIVE Final    Comment: RESULT CALLED TO, READ BACK BY AND VERIFIED WITH: RN K.MOON ON [id number] AT 0922 BY E.PARRISH (NOTE) SARS-CoV-2 target nucleic acids are DETECTED.  The SARS-CoV-2 RNA is generally detectable in upper respiratory specimens during the acute phase of infection. Positive results are indicative of the presence of the identified virus, but do not rule out bacterial infection or co-infection with other pathogens not detected by the test. Clinical correlation with patient history and other diagnostic information is necessary to determine patient infection status. The expected result is Negative.  Fact Sheet for Patients: BloggerCourse.com  Fact Sheet for Healthcare Providers: SeriousBroker.it  This test is not yet approved or cleared by the [place] FDA and  has been authorized for detection and/or diagnosis of SARS-CoV-2 by FDA under an Emergency Use Authorization (EUA).  This EUA will remain in effect (meaning this te st can be used) for the duration of  the COVID-19 declaration under Section 564(b)(1) of the Act, 21 U.S.C. section 360bbb-3(b)(1), unless the  authorization is terminated or revoked sooner.     Influenza A by PCR NEGATIVE NEGATIVE Final   Influenza B by PCR NEGATIVE NEGATIVE Final    Comment: (NOTE) The Xpert Xpress SARS-CoV-2/FLU/RSV plus assay is intended as  an aid in the diagnosis of influenza from Nasopharyngeal swab specimens and should not be used as a sole basis for treatment. Nasal washings and aspirates are unacceptable for Xpert Xpress SARS-CoV-2/FLU/RSV testing.  Fact Sheet for Patients: BloggerCourse.com  Fact Sheet for Healthcare Providers: SeriousBroker.it  This test is not yet approved or cleared by the [place] FDA and has been authorized for detection and/or diagnosis of SARS-CoV-2 by FDA under an Emergency Use Authorization (EUA). This EUA will remain in effect (meaning this test can be used) for the duration of the COVID-19 declaration under Section 564(b)(1) of the Act, 21 U.S.C. section 360bbb-3(b)(1), unless the authorization is terminated or revoked.  Performed at [facility], [address], [place], [place] [address]      Labs: Basic Metabolic Panel: Recent Labs  Lab 11/16/2020 0644 11/18/2020 0657 11/25/2020 0747  NA 136 138 129*  K 3.2* 3.2*  --   CL 98 98  --   CO2 28  --   --   GLUCOSE 154* 151*  --   BUN 8 10  --   CREATININE 0.79 0.70  --   CALCIUM 8.6*  --   --    Liver Function Tests: Recent Labs  Lab 11/14/2020 0644  AST 19  ALT 14  ALKPHOS 114  BILITOT 1.3*  PROT 6.3*  ALBUMIN 3.3*   No results for input(s): LIPASE, AMYLASE in the last 168 hours. No results for input(s): AMMONIA in the last 168 hours. CBC: Recent Labs  Lab 11/11/2020 0644 11/18/2020 0657  WBC 6.6  --   NEUTROABS 4.6  --   HGB 13.7 13.6  HCT 40.9 40.0  MCV 96.5  --   PLT 277  --    Cardiac Enzymes: No results for input(s): CKTOTAL, CKMB, CKMBINDEX, TROPONINI in the last 168 hours. D-Dimer No results for input(s): DDIMER in the last  72 hours. BNP: Invalid input(s): POCBNP CBG: Recent Labs  Lab 11/25/2020 0651  GLUCAP 151*   Anemia work up No results for input(s): VITAMINB12, FOLATE, FERRITIN, TIBC, IRON, RETICCTPCT in the last 72 hours. Urinalysis    Component Value Date/Time   COLORURINE Yellow 06/13/2013 1306   APPEARANCEUR Cloudy 06/13/2013 1306   LABSPEC 1.020 06/13/2013 1306   PHURINE 6.0 06/13/2013 1306   GLUCOSEU Negative 06/13/2013 1306   HGBUR 2+ 06/13/2013 1306   BILIRUBINUR Negative 06/13/2013 1306   KETONESUR Trace 06/13/2013 1306   PROTEINUR 100 mg/dL 08/67/6195 0932   NITRITE Negative 06/13/2013 1306   LEUKOCYTESUR Negative 06/13/2013 1306   Sepsis Labs Invalid input(s): PROCALCITONIN,  WBC,  LACTICIDVEN     SIGNED:  Rhetta Mura, MD  [facility] 12/21/2020, 5:45 PM Pager   If 7PM-7AM, please contact night-coverage [email or web address] Password [email or web address]

## 2020-12-10 DEATH — deceased

## 2022-07-30 IMAGING — CT CT HEAD CODE STROKE
4 series · 16 of 47 positions shown, 18 images · non-contrast
Comparison: None.

CLINICAL DATA: Code stroke.

EXAM:
CT HEAD WITHOUT CONTRAST
TECHNIQUE: Contiguous axial images were obtained from the base of the skull
through the vertex without intravenous contrast.

[Series 4: head 5.0 st · axial · 0.45mm/px · z∈[-46,+94]mm · 7 of 38 slices shown, 9 images]
[im 5/38  brain]
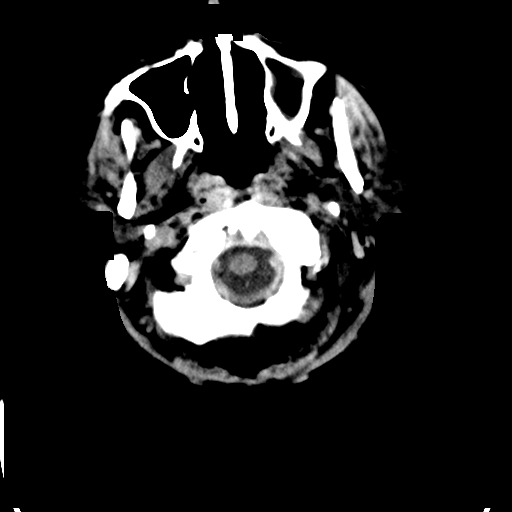
[im 5/38  bone]
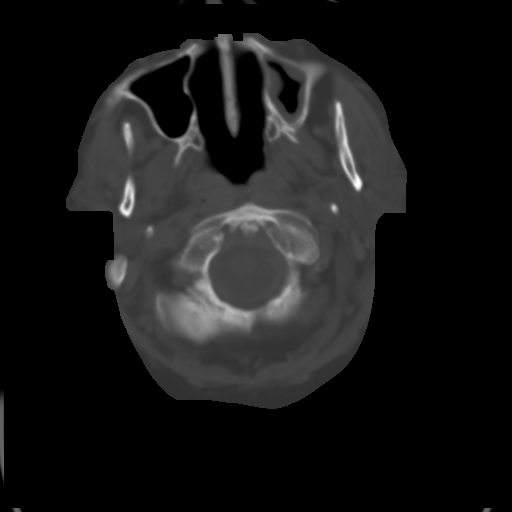
[im 10/38  brain]
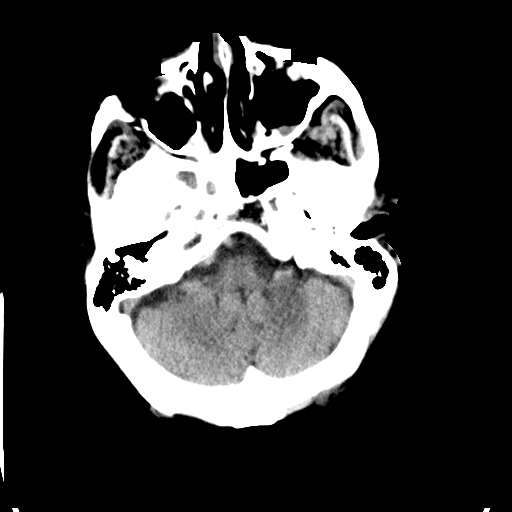
[im 14/38  brain]
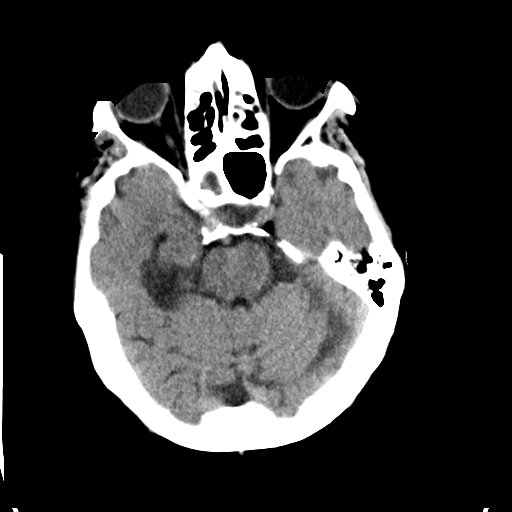
[im 19/38  brain]
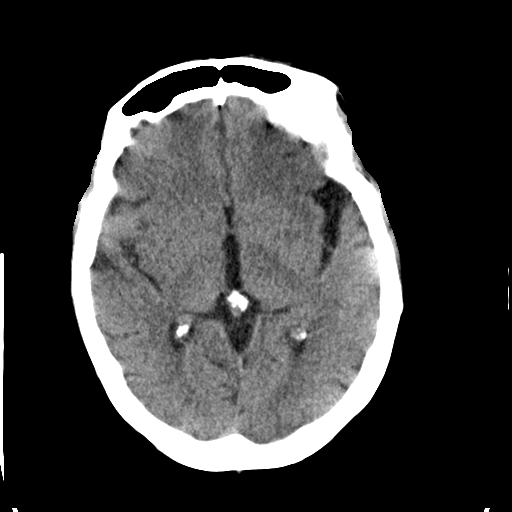
[im 24/38  brain]
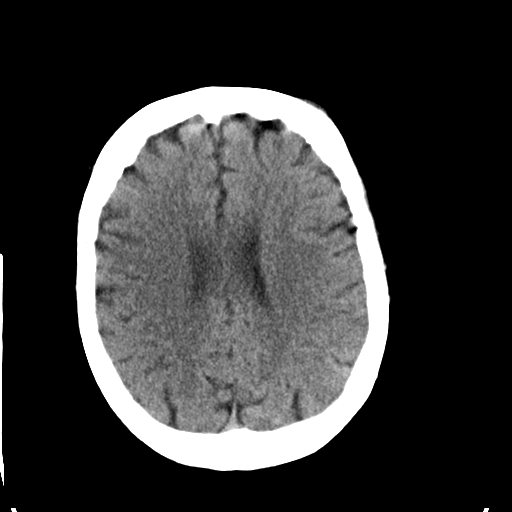
[im 24/38  bone]
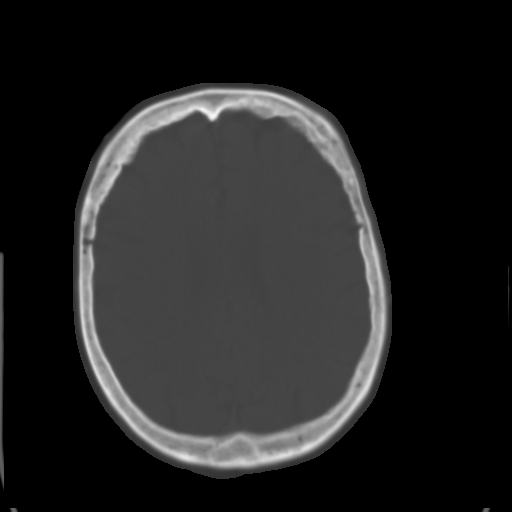
[im 28/38  brain]
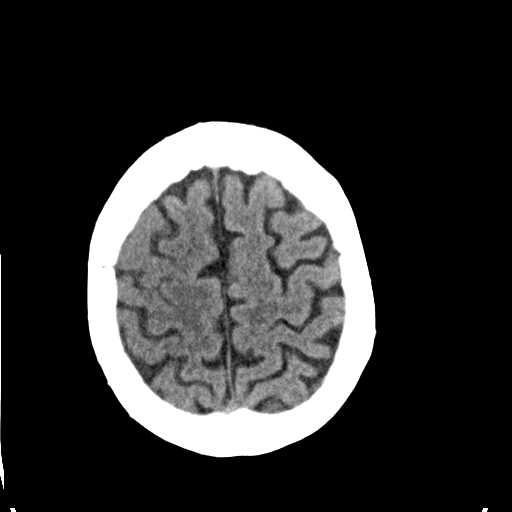
[im 33/38  brain]
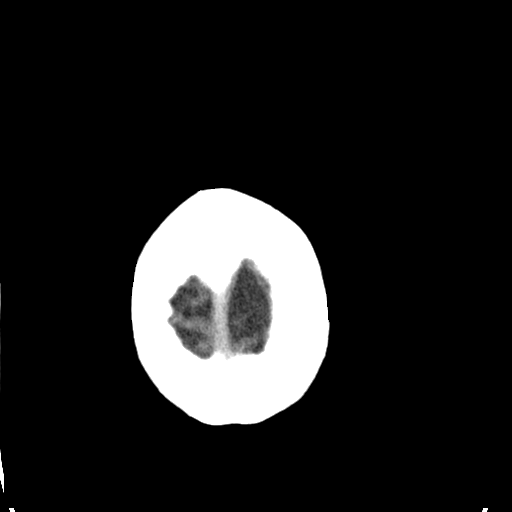

[Series 5: head 2.0 ax bone · axial · 0.45mm/px · z∈[-43,-9]mm · 3 of 94 slices shown]
[im 10/94  bone]
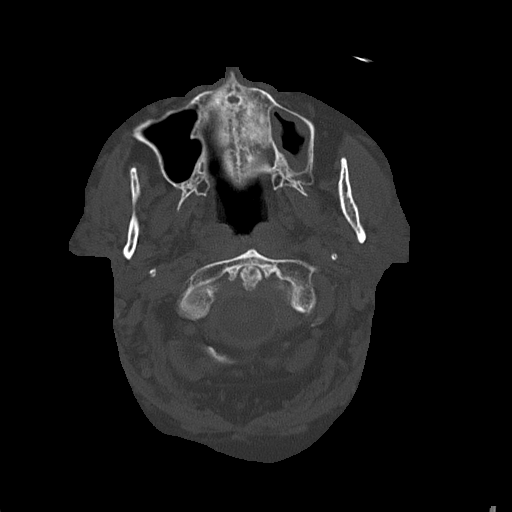
[im 19/94  bone]
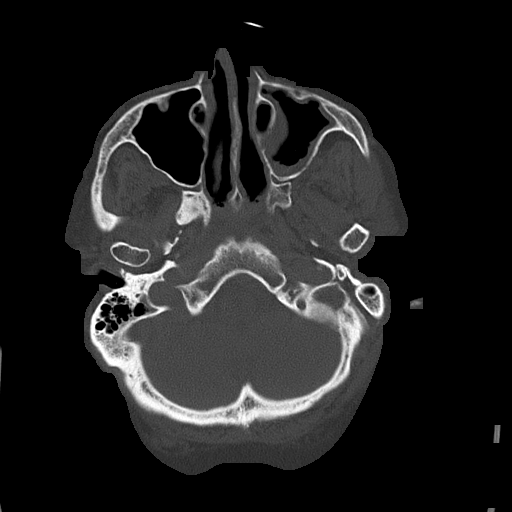
[im 28/94  bone]
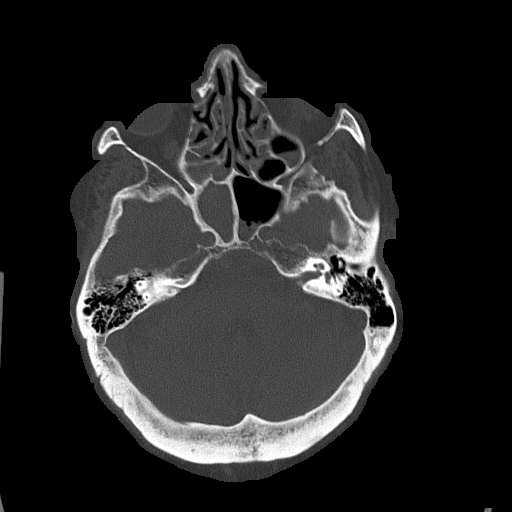

[Series 6: head 3.0 cor st · coronal · 0.37mm/px · 3 of 65 slices shown]
[im 24/65  brain]
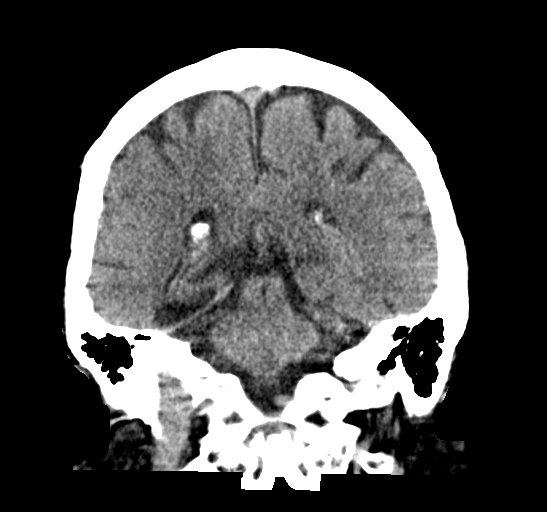
[im 30/65  brain]
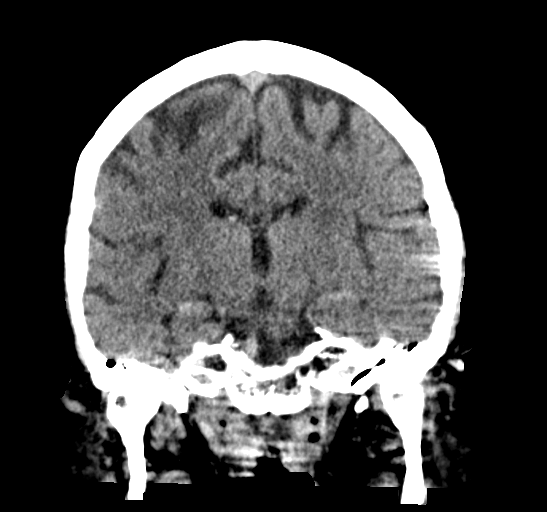
[im 35/65  brain]
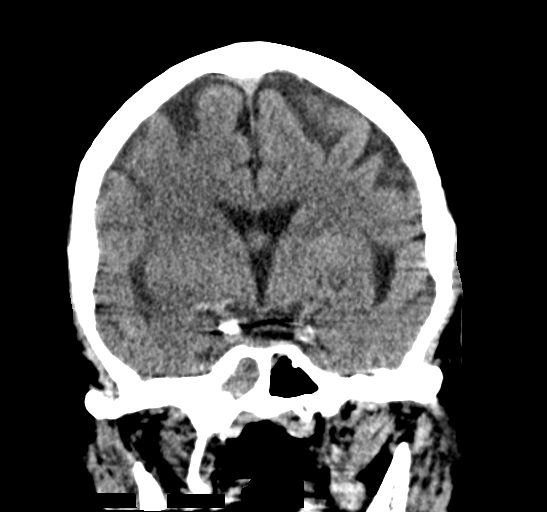

[Series 7: head 3.0 sag st · sagittal · 0.37mm/px · 3 of 50 slices shown]
[im 17/50  brain]
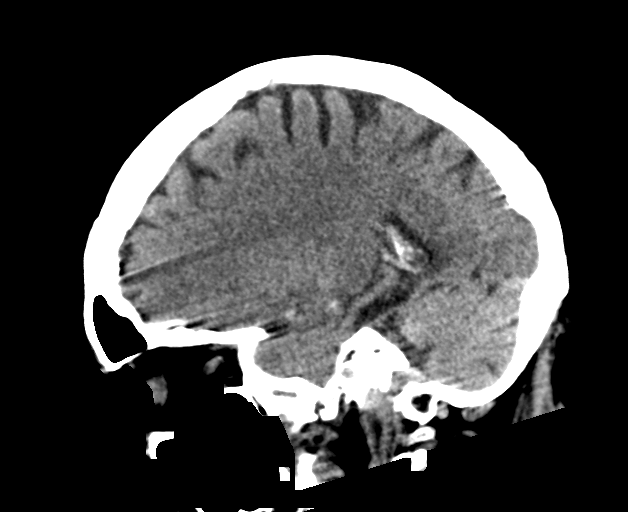
[im 25/50  brain]
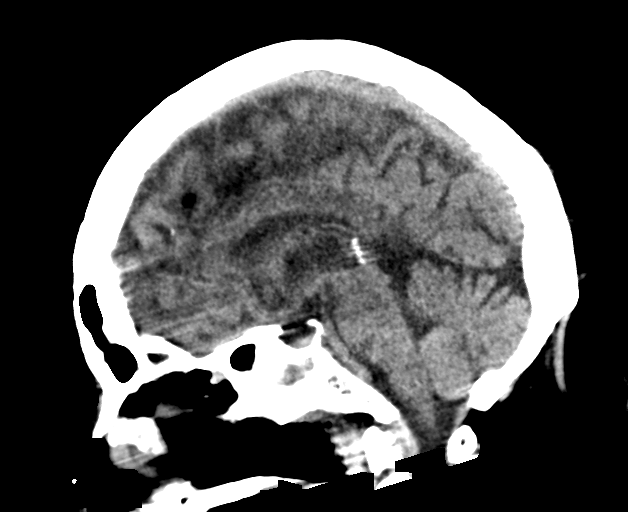
[im 33/50  brain]
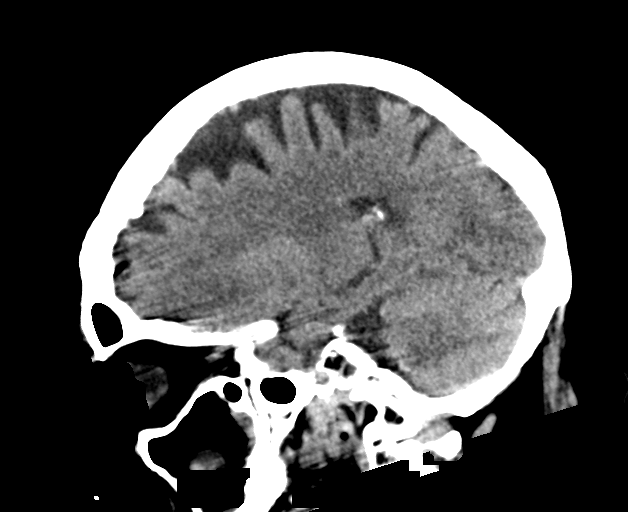

[16 of 47 positions shown; findings below may reference images not displayed]

FINDINGS: Brain: Motion limited study. There is suggestion of abnormal loss of
gray-white differentiation involving the right insula, right
caudate, right putamen, and the right frontal cortex, including
right proximal MCA territory. The distal right MCA territory may
have preserved gray-white. There also is hypoattenuation and loss of
gray differentiation right PCA territory including the
parahippocampal right temporal lobe and right occipital lobe. No
evidence of acute hemorrhage. No hydrocephalus. No mass lesion or
midline shift. Basal cisterns are patent.

Vascular: Hyperdense right proximal MCA/carotid terminus, concerning
thrombus.

Skull: No acute fracture.

Sinuses/Orbits: Mucosal thickening of the scattered sinuses

Other: No mastoid effusions.

ASPECTS ([place] Stroke Program Early CT Score)

- Ganglionic level infarction (caudate, lentiform nuclei, internal
capsule, insula, M1-M3 cortex): 2

- Supraganglionic infarction (M4-M6 cortex): 1

Total score (0-10 with 10 being normal): 3
IMPRESSION: 1. Motion limited study. Findings concerning for acute right MCA
territory infarct with loss of gray-white differentiation in the
right basal ganglia and right frontal cortex, as detailed above.
ASPECTS is 3
2. Hyperdense right proximal MCA/carotid terminus, concerning for
thrombus. Please see forthcoming CTA for further evaluation.
3. Age indeterminate right PCA territory infarct. Recommend MRI to
further characterize.
4. Paranasal sinus mucosal thickening.

Code stroke imaging results were communicated on 11/26/2020 at [DATE] to provider Dr. Diliyan via [DATE] a.m.
